# Patient Record
Sex: Female | Born: 1985 | Race: White | Hispanic: No | Marital: Married | Smoking: Never smoker
Health system: Southern US, Community
[De-identification: ages and names within clinical notes are randomized; demographics above are authoritative.]

## PROBLEM LIST (undated history)

## (undated) DIAGNOSIS — Z21 Asymptomatic human immunodeficiency virus [HIV] infection status: Secondary | ICD-10-CM

## (undated) DIAGNOSIS — T8859XA Other complications of anesthesia, initial encounter: Secondary | ICD-10-CM

## (undated) DIAGNOSIS — T4145XA Adverse effect of unspecified anesthetic, initial encounter: Secondary | ICD-10-CM

## (undated) DIAGNOSIS — T7840XA Allergy, unspecified, initial encounter: Secondary | ICD-10-CM

## (undated) DIAGNOSIS — B20 Human immunodeficiency virus [HIV] disease: Secondary | ICD-10-CM

## (undated) HISTORY — DX: Allergy, unspecified, initial encounter: T78.40XA

## (undated) HISTORY — PX: WISDOM TOOTH EXTRACTION: SHX21

---

## 2007-01-20 DIAGNOSIS — A6000 Herpesviral infection of urogenital system, unspecified: Secondary | ICD-10-CM | POA: Insufficient documentation

## 2008-07-15 ENCOUNTER — Emergency Department (HOSPITAL_COMMUNITY): Admission: EM | Admit: 2008-07-15 | Discharge: 2008-07-15 | Payer: Self-pay | Admitting: Emergency Medicine

## 2010-01-23 ENCOUNTER — Emergency Department (HOSPITAL_COMMUNITY): Admission: EM | Admit: 2010-01-23 | Discharge: 2010-01-23 | Payer: Self-pay | Admitting: Emergency Medicine

## 2011-08-05 LAB — WOUND CULTURE: Gram Stain: NONE SEEN

## 2013-07-17 ENCOUNTER — Ambulatory Visit (INDEPENDENT_AMBULATORY_CARE_PROVIDER_SITE_OTHER): Payer: BC Managed Care – PPO | Admitting: Family Medicine

## 2013-07-17 ENCOUNTER — Telehealth: Payer: Self-pay

## 2013-07-17 VITALS — BP 100/60 | HR 72 | Temp 98.9°F | Resp 16 | Ht 63.75 in | Wt 131.6 lb

## 2013-07-17 DIAGNOSIS — J029 Acute pharyngitis, unspecified: Secondary | ICD-10-CM

## 2013-07-17 LAB — POCT RAPID STREP A (OFFICE): Rapid Strep A Screen: NEGATIVE

## 2013-07-17 MED ORDER — MAGIC MOUTHWASH W/LIDOCAINE: 5.0000 mL | Freq: Four times a day (QID) | ORAL | Status: DC

## 2013-07-17 MED ORDER — AMOXICILLIN 875 MG PO TABS: 875.0000 mg | ORAL_TABLET | Freq: Two times a day (BID) | ORAL | Status: DC

## 2013-07-17 NOTE — Patient Instructions (Addendum)
Sore Throat A sore throat is pain, burning, irritation, or scratchiness of the throat. There is often pain or tenderness when swallowing or talking. A sore throat may be accompanied by other symptoms, such as coughing, sneezing, fever, and swollen neck glands. A sore throat is often the first sign of another sickness, such as a cold, flu, strep throat, or mononucleosis (commonly known as mono). Most sore throats go away without medical treatment. CAUSES  The most common causes of a sore throat include:  A viral infection, such as a cold, flu, or mono.  A bacterial infection, such as strep throat, tonsillitis, or whooping cough.  Seasonal allergies.  Dryness in the air.  Irritants, such as smoke or pollution.  Gastroesophageal reflux disease (GERD). HOME CARE INSTRUCTIONS   Only take over-the-counter medicines as directed by your caregiver.  Drink enough fluids to keep your urine clear or pale yellow.  Rest as needed.  Try using throat sprays, lozenges, or sucking on hard candy to ease any pain (if older than 4 years or as directed).  Sip warm liquids, such as broth, herbal tea, or warm water with honey to relieve pain temporarily. You may also eat or drink cold or frozen liquids such as frozen ice pops.  Gargle with salt water (mix 1 tsp salt with 8 oz of water).  Do not smoke and avoid secondhand smoke.  Put a cool-mist humidifier in your bedroom at night to moisten the air. You can also turn on a hot shower and sit in the bathroom with the door closed for 5 10 minutes. SEEK IMMEDIATE MEDICAL CARE IF:  You have difficulty breathing.  You are unable to swallow fluids, soft foods, or your saliva.  You have increased swelling in the throat.  Your sore throat does not get better in 7 days.  You have nausea and vomiting.  You have a fever or persistent symptoms for more than 2 3 days.  You have a fever and your symptoms suddenly get worse. MAKE SURE YOU:   Understand  these instructions.  Will watch your condition.  Will get help right away if you are not doing well or get worse. Document Released: 11/26/2004 Document Revised: 10/05/2012 Document Reviewed: 06/26/2012 Exit[employer] Patient Information 2014 [employer].  

## 2013-07-17 NOTE — Telephone Encounter (Signed)
Pt was seen today by dr Milus Glazier and she is wanting to know if she can have something for pain   Best number [phone]

## 2013-07-17 NOTE — Progress Notes (Signed)
  Subjective:    Patient ID: RAIZY AUZENNE, female    DOB: [birth year], 27 y.o.   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 States that she recently got back from a cruise to the [place] and was sick on the third day.  Now has bad sore throat x 4 days. Multiple strep infections when younger. Friend on cruise developed URI and cough prior to patient's coming down with illness   Review of Systems N, V, cough    Objective:   Physical Exam Alert and appropriate HEENT: Unremarkable except for the markedly swollen and red throat. Tonsils are approximately 2+ in size. Neck: Supple no adenopathy Skin: Without rash Results for orders placed in visit on 07/17/13  POCT RAPID STREP A (OFFICE)      Result Value Range   Rapid Strep A Screen Negative  Negative       Assessment & Plan:  Sore throat - Plan: POCT rapid strep A, Culture, Group A Strep  Signed, Elvina Sidle, MD

## 2013-07-18 ENCOUNTER — Telehealth: Payer: Self-pay | Admitting: Physician Assistant

## 2013-07-18 ENCOUNTER — Telehealth: Payer: Self-pay | Admitting: Family Medicine

## 2013-07-18 ENCOUNTER — Telehealth: Payer: Self-pay | Admitting: Radiology

## 2013-07-18 MED ORDER — HYDROCODONE-ACETAMINOPHEN 5-325 MG PO TABS: 1.0000 | ORAL_TABLET | Freq: Four times a day (QID) | ORAL | Status: DC | PRN

## 2013-07-18 NOTE — Addendum Note (Signed)
Addended byCaffie Damme on: 07/18/2013 01:29 PM   Modules accepted: Orders

## 2013-07-18 NOTE — Telephone Encounter (Signed)
Received call from [facility]- Pt allergic to CDN- we called in Norco 07/18/13. Pharmacist called to get Px changed.  Spoke to Dr. Elbert Ewings- changed Rx to Ultram 50mg  #10 Take 1 every 8 hours as needed for pain. 0 Refills. Eileen Stanford

## 2013-07-18 NOTE — Telephone Encounter (Signed)
Pharmacy called back about Rx/ they are having trouble with voicemail

## 2013-07-18 NOTE — Telephone Encounter (Signed)
See previous telephone note, pt requesting pain medication for sore throat.  Recommend 800mg  ibuprofen alternating with 1000mg  tylenol every 4 hours.  If pt is worsening, needs to RTC for further evaluation.  She has only been on amox for 24 hours, would expect some improvement within the next 24 hours if this is truly strep

## 2013-07-18 NOTE — Telephone Encounter (Signed)
Please call in Norco [phone]  #10 to be taken q8h prn

## 2013-07-18 NOTE — Telephone Encounter (Signed)
Called in, patient advised

## 2013-07-18 NOTE — Telephone Encounter (Signed)
Spoke with pt advised message from [place]. Pt understood and will keep [place] posted on progress.

## 2013-07-19 LAB — CULTURE, GROUP A STREP: Organism ID, Bacteria: NORMAL

## 2013-11-07 ENCOUNTER — Ambulatory Visit (INDEPENDENT_AMBULATORY_CARE_PROVIDER_SITE_OTHER): Payer: BC Managed Care – PPO | Admitting: Internal Medicine

## 2013-11-07 VITALS — BP 104/68 | HR 67 | Temp 98.3°F | Resp 16 | Ht 64.0 in | Wt 130.0 lb

## 2013-11-07 DIAGNOSIS — L089 Local infection of the skin and subcutaneous tissue, unspecified: Secondary | ICD-10-CM

## 2013-11-07 DIAGNOSIS — IMO0002 Reserved for concepts with insufficient information to code with codable children: Secondary | ICD-10-CM

## 2013-11-07 DIAGNOSIS — W57XXXS Bitten or stung by nonvenomous insect and other nonvenomous arthropods, sequela: Principal | ICD-10-CM

## 2013-11-07 DIAGNOSIS — S90466S Insect bite (nonvenomous), unspecified lesser toe(s), sequela: Principal | ICD-10-CM

## 2013-11-07 MED ORDER — DOXYCYCLINE HYCLATE 100 MG PO TABS: 100.0000 mg | ORAL_TABLET | Freq: Two times a day (BID) | ORAL | Status: DC

## 2013-11-07 NOTE — Progress Notes (Signed)
   Subjective:    Patient ID: Megan Mcclain, female    DOB: 1986/06/02, 28 y.o.   MRN: [id number]  HPI This chart was scribed for [facility] Tensley-MD by Smiley HousemanFallon Davis, Scribe. This patient was seen in room 4 and the patient's care was started at 8:03 PM.  HPI Comments: Megan KindsMerrick E Murch is a 28 y.o. female who presents to the [facility] complaining of a possible bug bite on her left fifth toe that occurred yesterday evening after work.  Pt states she placed her feet in her bedroom slippers when she started having pain.  She reports the area is red, swollen, and itches.  She reports, "it feels like my entire foot is itching now."  She states that compared to her other pinky toe, her left pinky toe is swollen.  She is allergic to Vicodin and Caine-1.    History reviewed. No pertinent past surgical history.  Allergies  Allergen Reactions  . Caine-1 [Lidocaine]     Patient can take lidocaine but novacaine causes swelling  . Vicodin [Hydrocodone-Acetaminophen] Nausea And Vomiting    There are no active problems to display for this patient.   Review of Systems  Constitutional: Negative for fever and chills.  HENT: Negative for congestion and rhinorrhea.   Respiratory: Negative for cough and shortness of breath.   Cardiovascular: Negative for chest pain.  Gastrointestinal: Negative for nausea, vomiting, abdominal pain and diarrhea.  Musculoskeletal: Negative for arthralgias and back pain.  Skin: Negative for color change and rash.  Neurological: Negative for syncope.       Objective:   Physical Exam  Nursing note and vitals reviewed. Constitutional: She is oriented to person, place, and time. She appears well-developed and well-nourished. No distress.  HENT:  Head: Normocephalic and atraumatic.  Eyes: Conjunctivae and EOM are normal. Right eye exhibits no discharge. Left eye exhibits no discharge.  Neck: Normal range of motion.  Cardiovascular: Normal rate.     Pulmonary/Chest: Effort normal. No respiratory distress.  Musculoskeletal: Normal range of motion.  Neurological: She is alert and oriented to person, place, and time.  Skin: Skin is warm and dry.  Lesion on medial aspect of the fifth toe at the edge of the nail that is a large vesicle with fluid that is cloudy surrounded by erythema.  This is tender to palpation.  The joint is uninvolved.  The rest of the skin is clear.    Psychiatric: She has a normal mood and affect. Her behavior is normal.    Triage Vitals: BP 104/68  Pulse 67  Temp(Src) 98.3 F (36.8 C) (Oral)  Resp 16  Ht 5\' 4"  (1.626 m)  Wt 130 lb (58.968 kg)  BMI 22.30 kg/m2  SpO2 100%  COORDINATION OF CARE: 8:07 PM-Will prescribe doxycycline.  Patient informed of current plan of treatment and evaluation and agrees with plan.       Assessment & Plan:  Nonvenomous insect bite of toe with infection, unspecified laterality, sequela    Meds ordered this encounter  Medications  . doxycycline (VIBRA-TABS) 100 MG tablet    Sig: Take 1 tablet (100 mg total) by mouth 2 (two) times daily.    Dispense:  14 tablet    Refill:  0      I have completed the patient encounter in its entirety as documented by the scribe, with editing by me where necessary. Taleah Bellantoni P. Merla Richesoolittle, M.D.

## 2013-12-12 DIAGNOSIS — J029 Acute pharyngitis, unspecified: Secondary | ICD-10-CM | POA: Insufficient documentation

## 2014-02-04 ENCOUNTER — Encounter (HOSPITAL_COMMUNITY): Payer: Self-pay | Admitting: Emergency Medicine

## 2014-02-04 ENCOUNTER — Emergency Department (HOSPITAL_COMMUNITY)
Admission: EM | Admit: 2014-02-04 | Discharge: 2014-02-04 | Disposition: A | Discharge status: Home / Self Care | Payer: BC Managed Care – PPO | Attending: Emergency Medicine | Admitting: Emergency Medicine

## 2014-02-04 ENCOUNTER — Emergency Department (HOSPITAL_COMMUNITY): Payer: BC Managed Care – PPO

## 2014-02-04 DIAGNOSIS — Y929 Unspecified place or not applicable: Secondary | ICD-10-CM | POA: Insufficient documentation

## 2014-02-04 DIAGNOSIS — S6992XA Unspecified injury of left wrist, hand and finger(s), initial encounter: Secondary | ICD-10-CM

## 2014-02-04 DIAGNOSIS — Y9389 Activity, other specified: Secondary | ICD-10-CM | POA: Insufficient documentation

## 2014-02-04 DIAGNOSIS — W010XXA Fall on same level from slipping, tripping and stumbling without subsequent striking against object, initial encounter: Secondary | ICD-10-CM | POA: Insufficient documentation

## 2014-02-04 DIAGNOSIS — S6980XA Other specified injuries of unspecified wrist, hand and finger(s), initial encounter: Secondary | ICD-10-CM | POA: Insufficient documentation

## 2014-02-04 DIAGNOSIS — M25449 Effusion, unspecified hand: Secondary | ICD-10-CM | POA: Insufficient documentation

## 2014-02-04 DIAGNOSIS — S6990XA Unspecified injury of unspecified wrist, hand and finger(s), initial encounter: Principal | ICD-10-CM | POA: Insufficient documentation

## 2014-02-04 MED ORDER — TRAMADOL HCL 50 MG PO TABS: 50.0000 mg | ORAL_TABLET | Freq: Four times a day (QID) | ORAL | Status: DC | PRN

## 2014-02-04 NOTE — ED Notes (Signed)
Pt states that she got home late last night and tripped over her dogs.  Braced herself on the wall and injured her thumb.  Swelling to lt thumb.

## 2014-02-04 NOTE — Discharge Instructions (Signed)
Take the prescribed medication as directed.  May take with tylenol for added relief. May continue icing and elevating your hand at home for pain and swelling control. Follow-up with your primary care physician as needed. Return to the ED for new or worsening symptoms.

## 2014-02-04 NOTE — ED Provider Notes (Signed)
CSN: [id number]     Arrival date & time 02/04/14  0806 History   First MD Initiated Contact with Patient 02/04/14 0813     Chief Complaint  Patient presents with  . Hand Injury     (Consider location/radiation/quality/duration/timing/severity/associated sxs/prior Treatment) Patient is a 28 y.o. female presenting with hand injury. The history is provided by the patient and medical records.  Hand Injury  This is a 28 year old female with no significant past medical history presenting to the ED for left thumb injury. Patient states she tripped over her dog and attempted to break her fall with her left hand.  She denies head trauma or LOC.  Patient said she had immediate onset of pain and swelling to base of the phone. She denies numbness or paresthesias.  No prior left hand injuries. Patient is right-hand dominant.  Past Medical History  Diagnosis Date  . Allergy    No past surgical history on file. Family History  Problem Relation Age of Onset  . Diabetes Mother   . Prostate cancer Father   . Allergies Father   . Allergies Brother   . Heart failure Paternal Grandmother    History  Substance Use Topics  . Smoking status: Never Smoker   . Smokeless tobacco: Not on file  . Alcohol Use: Not on file   OB History   Grav Para Term Preterm Abortions TAB SAB Ect Mult Living                 Review of Systems  Musculoskeletal: Positive for arthralgias and joint swelling.  All other systems reviewed and are negative.      Allergies  Caine-1 and Vicodin  Home Medications   Current Outpatient Rx  Name  Route  Sig  Dispense  Refill  . doxycycline (VIBRA-TABS) 100 MG tablet   Oral   Take 1 tablet (100 mg total) by mouth 2 (two) times daily.   14 tablet   0    There were no vitals taken for this visit. Physical Exam  Nursing note and vitals reviewed. Constitutional: She is oriented to person, place, and time. She appears well-developed and well-nourished. No distress.    HENT:  Head: Normocephalic and atraumatic.  Mouth/Throat: Oropharynx is clear and moist.  Eyes: Conjunctivae and EOM are normal. Pupils are equal, round, and reactive to light.  Neck: Normal range of motion. Neck supple.  Cardiovascular: Normal rate, regular rhythm and normal heart sounds.   Pulmonary/Chest: Effort normal and breath sounds normal. No respiratory distress. She has no wheezes.  Musculoskeletal:       Left hand: She exhibits tenderness and swelling. She exhibits normal range of motion, no bony tenderness, normal capillary refill, no deformity and no laceration. Normal sensation noted. Normal strength noted.       Hands: Swelling and tenderness to palpation at base of left thumb, no gross deformities; full flexion and extension but with some pain; full range of motion of wrist maintained; strong radial pulse and cap refill, sensation intact  Neurological: She is alert and oriented to person, place, and time.  Skin: Skin is warm and dry. She is not diaphoretic.  Psychiatric: She has a normal mood and affect.    ED Course  ORTHOPEDIC INJURY TREATMENT Date/Time: 02/04/2014 9:33 AM Performed by: Garlon HatchetSANDERS, Quinnton Bury M Authorized by: Garlon HatchetSANDERS, Noretta Frier M Consent: Verbal consent obtained. Risks and benefits: risks, benefits and alternatives were discussed Consent given by: patient Patient understanding: patient states understanding of the procedure being performed  Patient identity confirmed: verbally with patient Injury location: hand Location details: left hand Injury type: soft tissue Pre-procedure neurovascular assessment: neurovascularly intact Pre-procedure distal perfusion: normal Pre-procedure neurological function: normal Immobilization: brace Supplies used: elastic bandage Post-procedure neurovascular assessment: post-procedure neurovascularly intact Post-procedure distal perfusion: normal Post-procedure neurological function: normal Post-procedure range of motion:  normal Patient tolerance: Patient tolerated the procedure well with no immediate complications.   (including critical care time) Labs Review Labs Reviewed - No data to display Imaging Review Dg Finger Thumb Left  02/04/2014   CLINICAL DATA:  Traumatic injury and pain  EXAM: LEFT THUMB 2+V  COMPARISON:  None.  FINDINGS: There is no evidence of fracture or dislocation. There is no evidence of arthropathy or other focal bone abnormality. Soft tissues are unremarkable  IMPRESSION: No acute abnormality noted.   Electronically Signed   By: Alcide Clever M.D.   On: 02/04/2014 08:48     EKG Interpretation None      MDM   Final diagnoses:  Injury of thumb, left   X-ray negative for acute fracture or dislocation, likely finger jam.  Hand wrapped with ace-wrap for comfort with some improvement of pain.  Rx tramadol, continue RICE routine at home to help control sx.  Discussed plan with pt, she acknowledged understanding and agreed with plan of care. Return precautions advised for new or worsening symptoms.  Garlon Hatchet, PA-C 02/04/14 [phone]

## 2014-02-04 NOTE — ED Provider Notes (Signed)
Medical screening examination/treatment/procedure(s) were performed by non-physician practitioner and as supervising physician I was immediately available for consultation/collaboration.   EKG Interpretation None       Ethelda ChickMartha K Linker, MD 02/04/14 [id number]

## 2015-01-24 IMAGING — CR DG FINGER THUMB 2+V*L*
3 series · 3 of 3 positions shown · non-contrast
Comparison: None.

CLINICAL DATA: Traumatic injury and pain

EXAM:
LEFT THUMB 2+V

[x finger pa left]
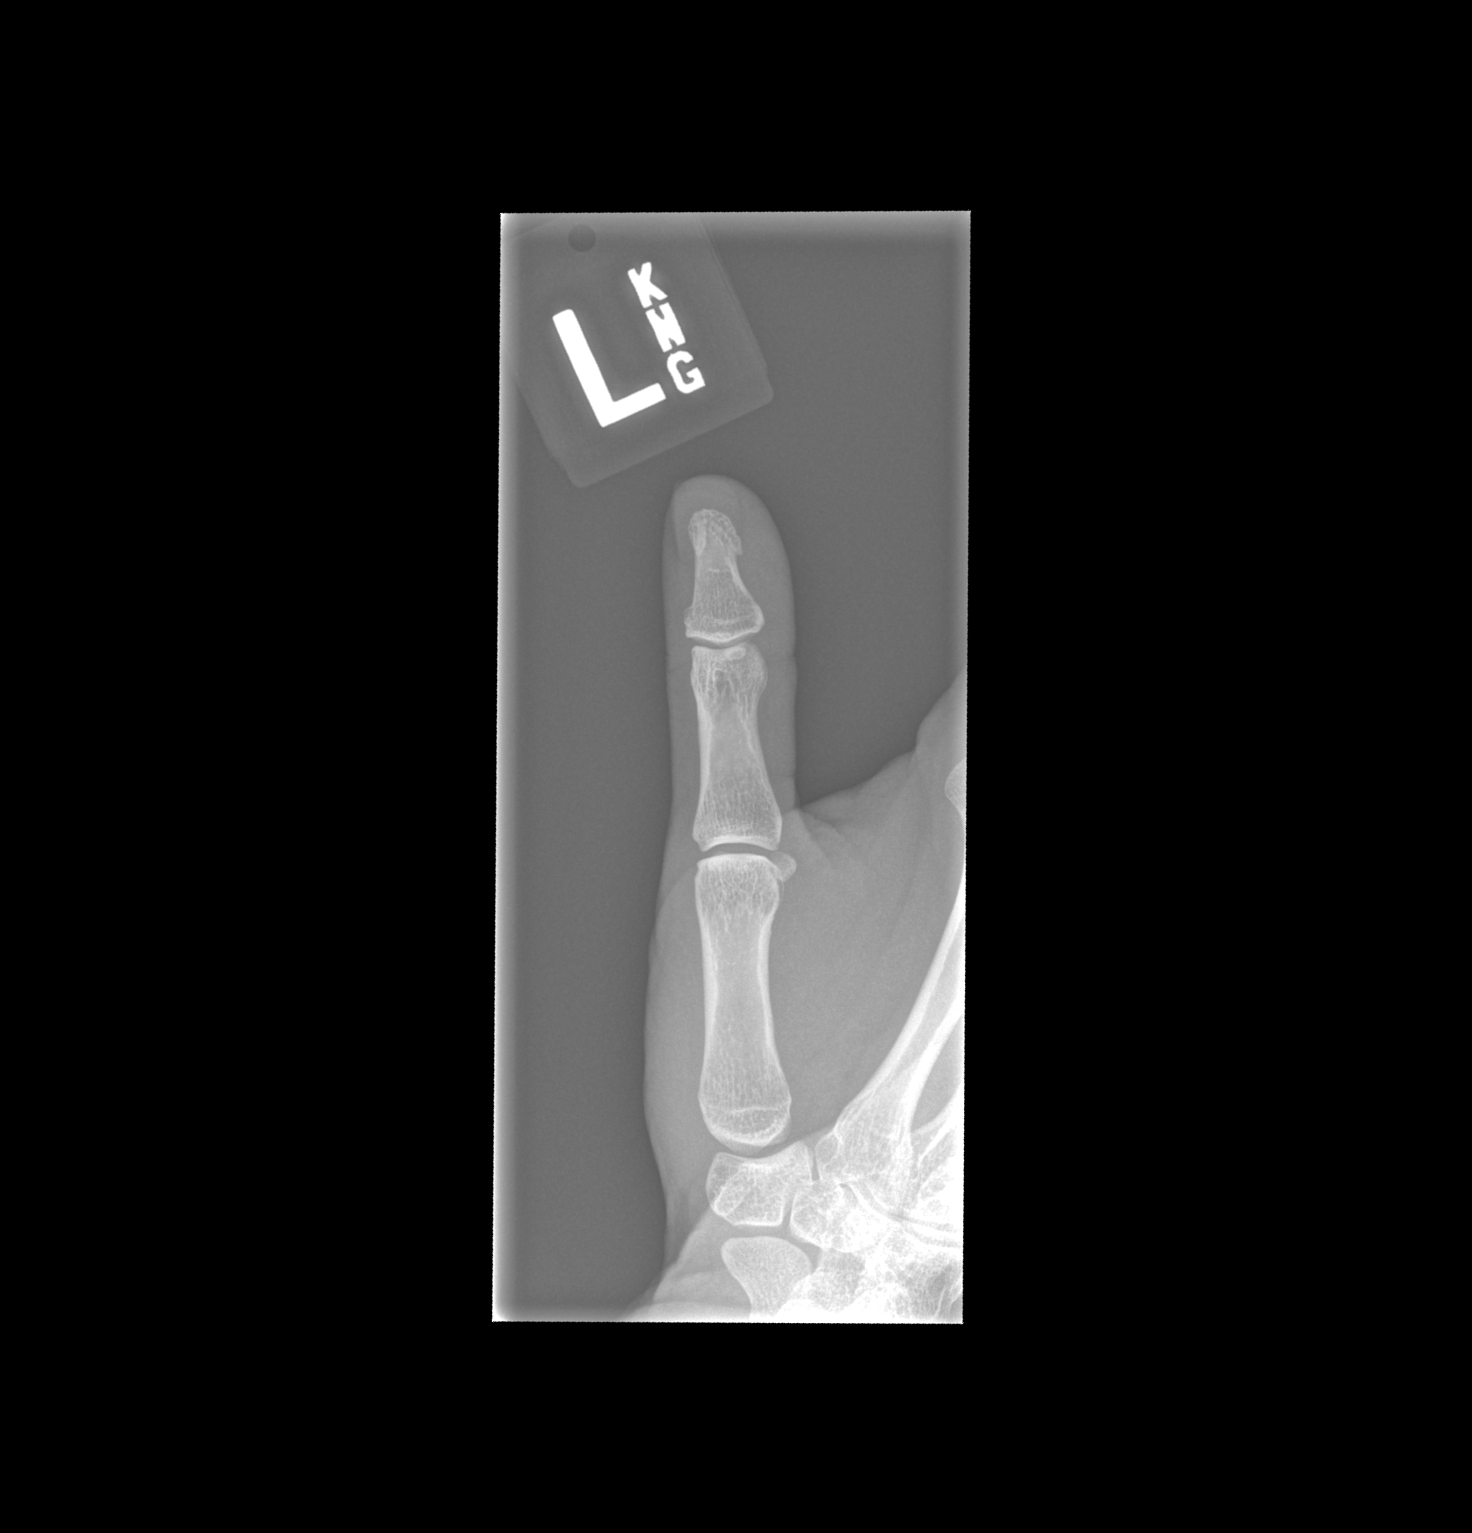

[x finger obl left]
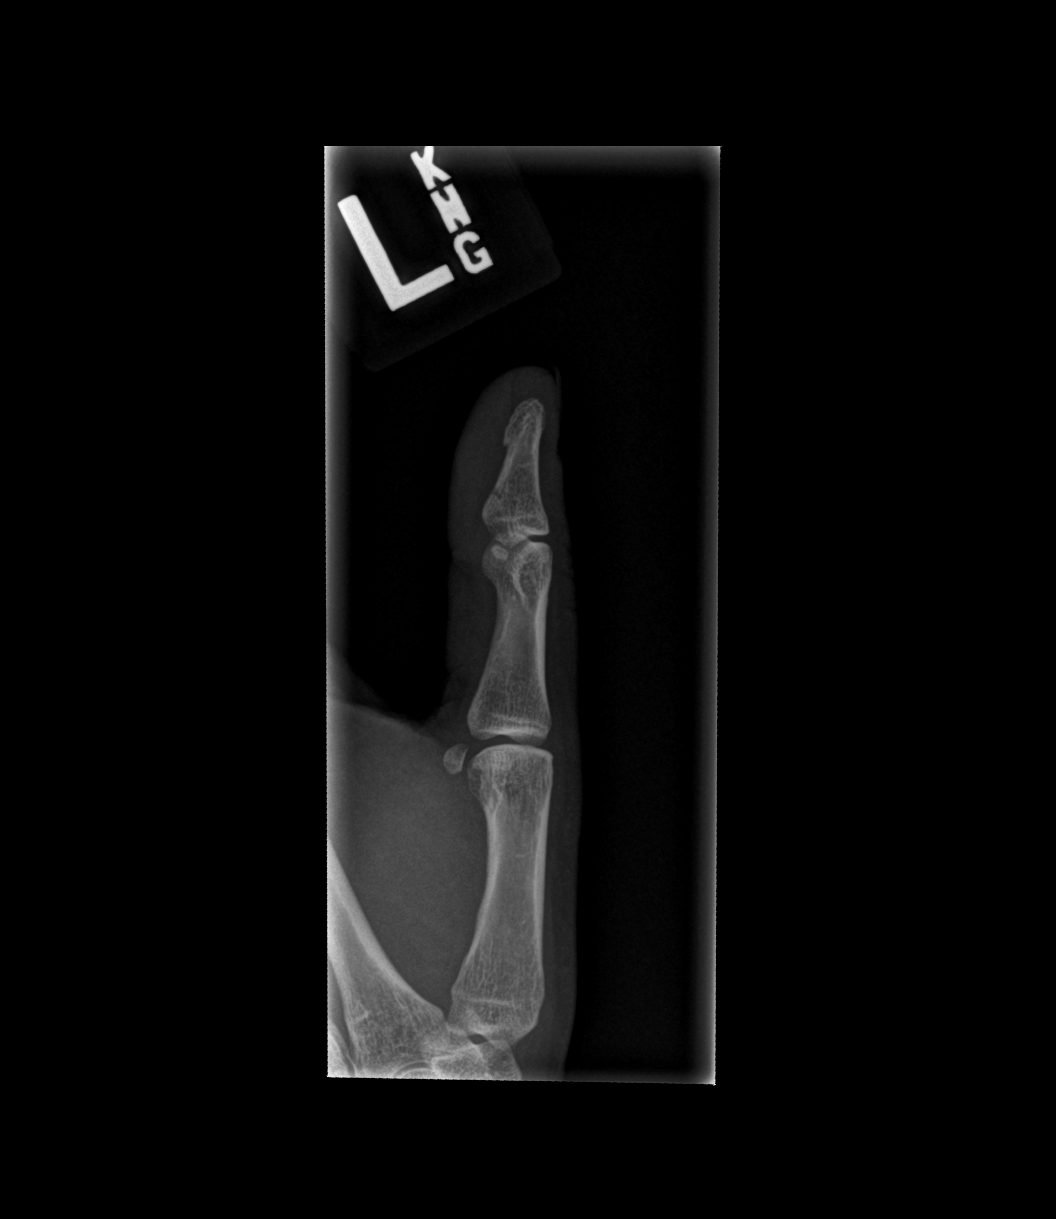

[x finger lat left]
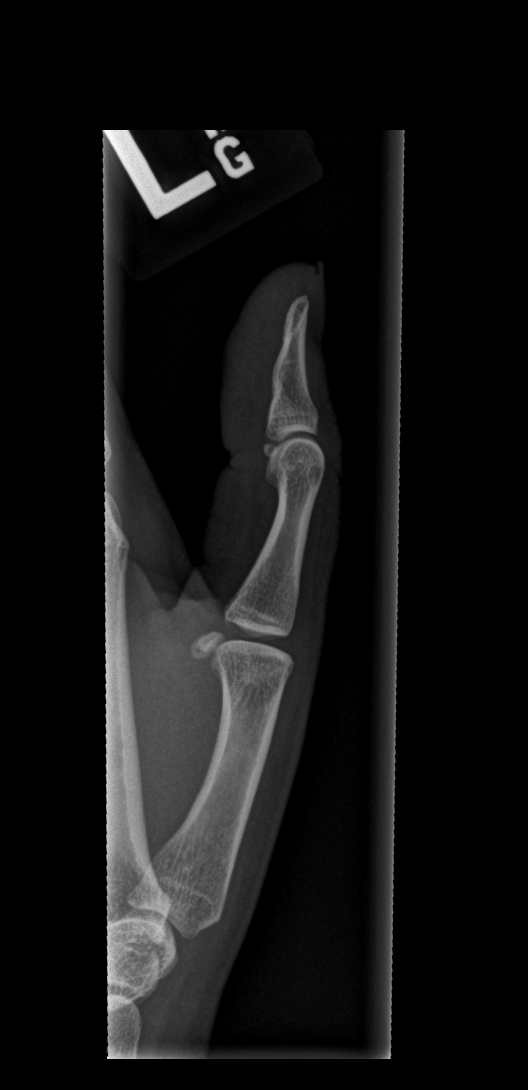

[3 of 3 positions shown; findings below may reference images not displayed]

FINDINGS: There is no evidence of fracture or dislocation. There is no
evidence of arthropathy or other focal bone abnormality. Soft
tissues are unremarkable
IMPRESSION: No acute abnormality noted.

## 2016-06-08 DIAGNOSIS — Z21 Asymptomatic human immunodeficiency virus [HIV] infection status: Secondary | ICD-10-CM | POA: Insufficient documentation

## 2016-06-30 DIAGNOSIS — Z21 Asymptomatic human immunodeficiency virus [HIV] infection status: Secondary | ICD-10-CM | POA: Insufficient documentation

## 2016-07-03 DIAGNOSIS — N760 Acute vaginitis: Secondary | ICD-10-CM | POA: Insufficient documentation

## 2016-11-18 ENCOUNTER — Encounter (HOSPITAL_COMMUNITY): Payer: Self-pay | Admitting: Emergency Medicine

## 2016-11-18 ENCOUNTER — Emergency Department (HOSPITAL_COMMUNITY)
Admission: EM | Admit: 2016-11-18 | Discharge: 2016-11-18 | Disposition: A | Discharge status: Home / Self Care | Payer: Managed Care, Other (non HMO) | Attending: Emergency Medicine | Admitting: Emergency Medicine

## 2016-11-18 DIAGNOSIS — R112 Nausea with vomiting, unspecified: Secondary | ICD-10-CM | POA: Diagnosis not present

## 2016-11-18 LAB — URINALYSIS, ROUTINE W REFLEX MICROSCOPIC
Bacteria, UA: NONE SEEN
GLUCOSE, UA: NEGATIVE mg/dL
HGB URINE DIPSTICK: NEGATIVE
Ketones, ur: 80 mg/dL — AB
Leukocytes, UA: NEGATIVE
NITRITE: NEGATIVE
PH: 5 (ref 5.0–8.0)
Protein, ur: 100 mg/dL — AB
SPECIFIC GRAVITY, URINE: 1.044 — AB (ref 1.005–1.030)

## 2016-11-18 LAB — CBC
HCT: 40.2 % (ref 36.0–46.0)
HEMOGLOBIN: 14.2 g/dL (ref 12.0–15.0)
MCH: 35.7 pg — AB (ref 26.0–34.0)
MCHC: 35.3 g/dL (ref 30.0–36.0)
MCV: 101 fL — ABNORMAL HIGH (ref 78.0–100.0)
PLATELETS: 198 10*3/uL (ref 150–400)
RBC: 3.98 MIL/uL (ref 3.87–5.11)
RDW: 12.1 % (ref 11.5–15.5)
WBC: 15.2 10*3/uL — ABNORMAL HIGH (ref 4.0–10.5)

## 2016-11-18 LAB — COMPREHENSIVE METABOLIC PANEL
ALBUMIN: 4.3 g/dL (ref 3.5–5.0)
ALK PHOS: 49 U/L (ref 38–126)
ALT: 9 U/L — AB (ref 14–54)
ANION GAP: 9 (ref 5–15)
AST: 18 U/L (ref 15–41)
BILIRUBIN TOTAL: 0.8 mg/dL (ref 0.3–1.2)
BUN: 18 mg/dL (ref 6–20)
CALCIUM: 9.3 mg/dL (ref 8.9–10.3)
CO2: 23 mmol/L (ref 22–32)
CREATININE: 0.74 mg/dL (ref 0.44–1.00)
Chloride: 104 mmol/L (ref 101–111)
GFR calc Af Amer: >60 mL/min (ref >60)
GFR calc non Af Amer: >60 mL/min (ref >60)
GLUCOSE: 99 mg/dL (ref 65–99)
Potassium: 3.6 mmol/L (ref 3.5–5.1)
Sodium: 136 mmol/L (ref 135–145)
TOTAL PROTEIN: 7.8 g/dL (ref 6.5–8.1)

## 2016-11-18 LAB — LIPASE, BLOOD: Lipase: 20 U/L (ref 11–51)

## 2016-11-18 LAB — PREGNANCY, URINE: Preg Test, Ur: NEGATIVE

## 2016-11-18 MED ORDER — ONDANSETRON 8 MG PO TBDP: 8.0000 mg | ORAL_TABLET | Freq: Three times a day (TID) | ORAL | 0 refills | Status: DC | PRN

## 2016-11-18 MED ORDER — SODIUM CHLORIDE 0.9 % IV BOLUS (SEPSIS)
1000.0000 mL | Freq: Once | INTRAVENOUS | Status: AC
  Administered 2016-11-18: 1000 mL via INTRAVENOUS

## 2016-11-18 MED ORDER — ONDANSETRON HCL 4 MG/2ML IJ SOLN
4.0000 mg | Freq: Once | INTRAMUSCULAR | Status: AC | PRN
  Administered 2016-11-18: 4 mg via INTRAVENOUS
  Filled 2016-11-18: qty 2

## 2016-11-18 NOTE — ED Triage Notes (Signed)
Per pt, states N/V since yesterday-cant keep POs down-no diarrhea, no abdominal pain

## 2016-11-18 NOTE — ED Provider Notes (Signed)
[facility]-EMERGENCY DEPT Provider Note   CSN: [id number] Arrival date & time: 11/18/16  0827     History   Chief Complaint Chief Complaint  Patient presents with  . Nausea  . Emesis    HPI Megan Mcclain is a 31 y.o. female.  HPI Patient presents emergency department nausea vomiting since yesterday.  She's had decreased oral intake.  She denies abdominal pain.  Denies diarrhea.  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  No dysuria or urinary frequency.  No missed menstrual cycles.  Symptoms are moderate in severity   Past Medical History:  Diagnosis Date  . Allergy     There are no active problems to display for this patient.   History reviewed. No pertinent surgical history.  OB History    No data available       Home Medications    Prior to Admission medications   Medication Sig Start Date End Date Taking? Authorizing Provider  DM-Doxylamine-Acetaminophen (VICKS NYQUIL COLD & FLU) 15-6.25-325 MG CAPS Take 2 capsules by mouth every 4 (four) hours as needed (for cold).   Yes Historical Provider, MD  DM-Phenylephrine-Acetaminophen (VICKS DAYQUIL COLD & FLU) 10-5-325 MG CAPS Take 2 capsules by mouth every 4 (four) hours as needed (for cold).   Yes Historical Provider, MD  emtricitabine-rilpivir-tenofovir AF (ODEFSEY) 200-25-25 MG TABS tablet Take 1 tablet by mouth at bedtime.   Yes Historical Provider, MD  Multiple Vitamin (MULTIVITAMIN WITH MINERALS) TABS tablet Take 1 tablet by mouth daily.   Yes Historical Provider, MD  ondansetron (ZOFRAN ODT) 8 MG disintegrating tablet Take 1 tablet (8 mg total) by mouth every 8 (eight) hours as needed for nausea or vomiting. 11/18/16   Azalia BilisKevin Zakariyah Freimark, MD    Family History Family History  Problem Relation Age of Onset  . Diabetes Mother   . Prostate cancer Father   . Allergies Father   . Allergies Brother   .  Heart failure Paternal Grandmother     Social History Social History  Substance Use Topics  . Smoking status: Never Smoker  . Smokeless tobacco: Not on file  . Alcohol use Yes     Allergies   Other and Vicodin [hydrocodone-acetaminophen]   Review of Systems Review of Systems  All other systems reviewed and are negative.    Physical Exam Updated Vital Signs BP 116/77   Pulse 107   Temp 98.1 F (36.7 C) (Oral)   Resp 18   LMP 11/02/2016   SpO2 100%   Physical Exam  Constitutional: She is oriented to person, place, and time. She appears well-developed and well-nourished. No distress.  HENT:  Head: Normocephalic and atraumatic.  Eyes: EOM are normal.  Neck: Normal range of motion.  Cardiovascular: Normal rate and regular rhythm.   Pulmonary/Chest: Effort normal and breath sounds normal.  Abdominal: Soft. She exhibits no distension. There is no tenderness.  Musculoskeletal: Normal range of motion.  Neurological: She is alert and oriented to person, place, and time.  Skin: Skin is warm and dry.  Psychiatric: She has a normal mood and affect. Judgment normal.  Nursing note and vitals reviewed.    ED Treatments / Results  Labs (all labs ordered are listed, but only abnormal results are displayed) Labs Reviewed  COMPREHENSIVE METABOLIC PANEL - Abnormal; Notable for the following:       Result Value  ALT 9 (*)    All other components within normal limits  CBC - Abnormal; Notable for the following:    WBC 15.2 (*)    MCV 101.0 (*)    MCH 35.7 (*)    All other components within normal limits  URINALYSIS, ROUTINE W REFLEX MICROSCOPIC - Abnormal; Notable for the following:    Color, Urine AMBER (*)    Specific Gravity, Urine 1.044 (*)    Bilirubin Urine SMALL (*)    Ketones, ur 80 (*)    Protein, ur 100 (*)    Squamous Epithelial / LPF 0-5 (*)    All other components within normal limits  LIPASE, BLOOD  PREGNANCY, URINE    EKG  EKG Interpretation None         Radiology No results found.  Procedures Procedures (including critical care time)  Medications Ordered in ED Medications  ondansetron (ZOFRAN) injection 4 mg (4 mg Intravenous Given 11/18/16 0849)  sodium chloride 0.9 % bolus 1,000 mL (0 mLs Intravenous Stopped 11/18/16 0926)     Initial Impression / Assessment and Plan / ED Course  I have reviewed the triage vital signs and the nursing notes.  Pertinent labs & imaging results that were available during my care of the patient were reviewed by me and considered in my medical decision making (see chart for details).  Clinical Course     10:50 AM Patient feels much better.  Repeat abdominal exam is benign.  Home with Zofran.  She understands to return to the ER for new or worsening symptoms  Final Clinical Impressions(s) / ED Diagnoses   Final diagnoses:  Nausea and vomiting, intractability of vomiting not specified, unspecified vomiting type    New Prescriptions New Prescriptions   ONDANSETRON (ZOFRAN ODT) 8 MG DISINTEGRATING TABLET    Take 1 tablet (8 mg total) by mouth every 8 (eight) hours as needed for nausea or vomiting.     Azalia Bilis, MD 11/18/16 1050

## 2019-11-02 DIAGNOSIS — L209 Atopic dermatitis, unspecified: Secondary | ICD-10-CM | POA: Insufficient documentation

## 2021-08-11 DIAGNOSIS — N979 Female infertility, unspecified: Secondary | ICD-10-CM | POA: Insufficient documentation

## 2021-11-02 NOTE — L&D Delivery Note (Signed)
Operative Note  PROCEDURE DATE: 06/19/22   PREOPERATIVE DIAGNOSIS: malpresentation, PROM, HIV+   POSTOPERATIVE DIAGNOSIS: The same   PROCEDURE:    Primary Low Transverse Cesarean Section   SURGEON:  Dr. Nilda Simmer   INDICATIONS: This is a 36 y.o. yo G1P0 at [redacted]w[redacted]d requiring cesarean section secondary to PROM and malpresentation. Baby has been breech or transverse in the office, upon presentation baby in oblique presentation.   Decision made to proceed with LTCS. The risks of cesarean section discussed with the patient included but were not limited to: bleeding which may require transfusion or reoperation; infection which may require antibiotics; injury to bowel, bladder, ureters or other surrounding organs; injury to the fetus; need for additional procedures including hysterectomy in the event of a life-threatening hemorrhage; placental abnormalities wth subsequent pregnancies, incisional problems, thromboembolic phenomenon and other postoperative/anesthesia complications. The patient agreed with the proposed plan, giving informed consent for the procedure.     FINDINGS:  Viable female infant in oblique > vertex presentation, APGARs 9, 9,  Weight pending, Amniotic fluid clear,  Intact placenta, three vessel cord.  Grossly normal uterus .   ANESTHESIA:    Epidural ESTIMATED BLOOD LOSS: Per record SPECIMENS: Placenta for routine COMPLICATIONS: None immediate    PROCEDURE IN DETAIL:  The patient received intravenous antibiotics (Ancef and azithromycin for ROM) and had sequential compression devices applied to her lower extremities while in the preoperative area.  She was then taken to the operating room where epidural anesthesia was dosed up to surgical level and was found to be adequate. She was then placed in a dorsal supine position with a leftward tilt, and prepped and draped in a sterile manner.  A foley catheter was placed into her bladder and attached to constant gravity.  After an  adequate timeout was performed, a Pfannenstiel skin incision was made with scalpel and carried through to the underlying layer of fascia. The fascia was incised in the midline and this incision was extended bilaterally using the Mayo scissors. Kocher clamps were applied to the superior aspect of the fascial incision and the underlying rectus muscles were dissected off bluntly. A similar process was carried out on the inferior aspect of the facial incision. The rectus muscles were separated in the midline bluntly and the peritoneum was entered bluntly.  A bladder flap was created sharply and developed bluntly. A transverse hysterotomy was made with a scalpel and extended bilaterally bluntly. The bladder blade was then removed. The infant was successfully delivered, and cord was clamped and cut and infant was handed over to awaiting neonatology team. Uterine massage was then administered and the placenta delivered intact with three-vessel cord. The uterus was cleared of clot and debris.  The hysterotomy was closed with 0 vicryl.  A second imbricating suture of 0-vicryl was used to reinforce the incision and aid in hemostasis.The fascia was closed with 0-Vicryl in a running fashion with good restoration of anatomy.  The subcutaneus tissue was irrigated and was reapproximated using running plain gut.  The skin was closed with 4-0 Vicryl in a subcuticular fashion.  All surgical site and was hemostatic at end of procedure without any further bleeding on exam.    Pt tolerated the procedure well. All sponge/lap/needle counts were correct  X 2. Pt taken to recovery room in stable condition.     Nilda Simmer MD

## 2021-11-19 LAB — OB RESULTS CONSOLE RUBELLA ANTIBODY, IGM: Rubella: IMMUNE

## 2021-11-19 LAB — OB RESULTS CONSOLE HEPATITIS B SURFACE ANTIGEN: Hepatitis B Surface Ag: NEGATIVE

## 2021-11-19 LAB — OB RESULTS CONSOLE GC/CHLAMYDIA
Chlamydia: NEGATIVE
Neisseria Gonorrhea: NEGATIVE

## 2021-11-19 LAB — OB RESULTS CONSOLE HIV ANTIBODY (ROUTINE TESTING): HIV: REACTIVE

## 2021-11-19 LAB — OB RESULTS CONSOLE RPR: RPR: NONREACTIVE

## 2021-11-19 LAB — OB RESULTS CONSOLE ABO/RH: RH Type: POSITIVE

## 2021-11-19 LAB — OB RESULTS CONSOLE ANTIBODY SCREEN: Antibody Screen: NEGATIVE

## 2021-11-19 LAB — HEPATITIS C ANTIBODY: HCV Ab: NEGATIVE

## 2022-01-16 DIAGNOSIS — O98912 Unspecified maternal infectious and parasitic disease complicating pregnancy, second trimester: Secondary | ICD-10-CM | POA: Insufficient documentation

## 2022-06-04 LAB — OB RESULTS CONSOLE GBS: GBS: NEGATIVE

## 2022-06-09 ENCOUNTER — Encounter (HOSPITAL_COMMUNITY): Payer: Self-pay

## 2022-06-09 ENCOUNTER — Encounter (HOSPITAL_COMMUNITY): Payer: Self-pay | Admitting: *Deleted

## 2022-06-09 NOTE — Patient Instructions (Signed)
Megan Mcclain  06/09/2022   Your procedure is scheduled on:  06/23/2022  Arrive at 0530 at Entrance C on [employer] at [facility]  and [employer]. You are invited to use the FREE valet parking or use the Visitor's parking deck.  Pick up the phone at the desk and dial [phone].  Call this number if you have problems the morning of surgery: [phone]  Remember:   Do not eat food:(After Midnight) Desps de medianoche.  Do not drink clear liquids: (After Midnight) Desps de medianoche.  Take these medicines the morning of surgery with A SIP OF WATER:  Take odefsey as prescribed   Do not wear jewelry, make-up or nail polish.  Do not wear lotions, powders, or perfumes. Do not wear deodorant.  Do not shave 48 hours prior to surgery.  Do not bring valuables to the hospital.  [facility] is not   responsible for any belongings or valuables brought to the hospital.  Contacts, dentures or bridgework may not be worn into surgery.  Leave suitcase in the car. After surgery it may be brought to your room.  For patients admitted to the hospital, checkout time is 11:00 AM the day of              discharge.      Please read over the following fact sheets that you were given:     Preparing for Surgery

## 2022-06-18 ENCOUNTER — Encounter (HOSPITAL_COMMUNITY): Payer: Self-pay | Admitting: Obstetrics and Gynecology

## 2022-06-18 ENCOUNTER — Inpatient Hospital Stay (HOSPITAL_COMMUNITY)
Admission: AD | Admit: 2022-06-18 | Discharge: 2022-06-21 | DRG: 787 | Disposition: A | Discharge status: Home / Self Care | Payer: Managed Care, Other (non HMO) | Attending: Obstetrics and Gynecology | Admitting: Obstetrics and Gynecology

## 2022-06-18 DIAGNOSIS — O322XX0 Maternal care for transverse and oblique lie, not applicable or unspecified: Secondary | ICD-10-CM | POA: Diagnosis present

## 2022-06-18 DIAGNOSIS — O4292 Full-term premature rupture of membranes, unspecified as to length of time between rupture and onset of labor: Principal | ICD-10-CM | POA: Diagnosis present

## 2022-06-18 DIAGNOSIS — Z3A39 39 weeks gestation of pregnancy: Secondary | ICD-10-CM | POA: Diagnosis not present

## 2022-06-18 DIAGNOSIS — Z3A38 38 weeks gestation of pregnancy: Secondary | ICD-10-CM | POA: Diagnosis not present

## 2022-06-18 DIAGNOSIS — O9832 Other infections with a predominantly sexual mode of transmission complicating childbirth: Secondary | ICD-10-CM | POA: Diagnosis present

## 2022-06-18 DIAGNOSIS — O9872 Human immunodeficiency virus [HIV] disease complicating childbirth: Secondary | ICD-10-CM | POA: Diagnosis present

## 2022-06-18 DIAGNOSIS — Z21 Asymptomatic human immunodeficiency virus [HIV] infection status: Secondary | ICD-10-CM | POA: Diagnosis present

## 2022-06-18 DIAGNOSIS — A6000 Herpesviral infection of urogenital system, unspecified: Secondary | ICD-10-CM | POA: Diagnosis present

## 2022-06-18 DIAGNOSIS — Z3490 Encounter for supervision of normal pregnancy, unspecified, unspecified trimester: Secondary | ICD-10-CM

## 2022-06-18 DIAGNOSIS — O26893 Other specified pregnancy related conditions, third trimester: Secondary | ICD-10-CM | POA: Diagnosis present

## 2022-06-18 LAB — CBC
HCT: 29.3 % — ABNORMAL LOW (ref 36.0–46.0)
Hemoglobin: 10.3 g/dL — ABNORMAL LOW (ref 12.0–15.0)
MCH: 35.6 pg — ABNORMAL HIGH (ref 26.0–34.0)
MCHC: 35.2 g/dL (ref 30.0–36.0)
MCV: 101.4 fL — ABNORMAL HIGH (ref 80.0–100.0)
Platelets: 239 10*3/uL (ref 150–400)
RBC: 2.89 MIL/uL — ABNORMAL LOW (ref 3.87–5.11)
RDW: 12.8 % (ref 11.5–15.5)
WBC: 13 10*3/uL — ABNORMAL HIGH (ref 4.0–10.5)
nRBC: 0 % (ref 0.0–0.2)

## 2022-06-18 MED ORDER — SODIUM CHLORIDE 0.9 % IV SOLN
500.0000 mg | INTRAVENOUS | Status: AC
  Administered 2022-06-19: 500 mg via INTRAVENOUS
  Filled 2022-06-18: qty 5

## 2022-06-18 MED ORDER — ZIDOVUDINE 10 MG/ML IV SOLN
1.0000 mg/kg/h | INTRAVENOUS | Status: DC
  Administered 2022-06-19: 1 mg/kg/h via INTRAVENOUS
  Filled 2022-06-18 (×3): qty 40

## 2022-06-18 MED ORDER — CEFAZOLIN SODIUM-DEXTROSE 2-4 GM/100ML-% IV SOLN
2.0000 g | INTRAVENOUS | Status: AC
  Administered 2022-06-19: 2 g via INTRAVENOUS

## 2022-06-18 MED ORDER — ZIDOVUDINE 10 MG/ML IV SOLN
2.0000 mg/kg | Freq: Once | INTRAVENOUS | Status: AC
  Administered 2022-06-19: 155 mg via INTRAVENOUS
  Filled 2022-06-18: qty 15.5

## 2022-06-18 MED ORDER — SOD CITRATE-CITRIC ACID 500-334 MG/5ML PO SOLN
30.0000 mL | ORAL | Status: AC
  Administered 2022-06-19: 30 mL via ORAL
  Filled 2022-06-18: qty 30

## 2022-06-18 NOTE — H&P (Signed)
Megan Mcclain is a 36 y.o. female presenting for scheduled cesarean section s/s malpresentation. She is HIV positive and has had viral suppressed throughout entire pregnancy. She has been on dolutegravir and Descovy. 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 She is expecting a RR girl - "Megan Mcclain"!!     OB History     Gravida  1   Para      Term      Preterm      AB      Living         SAB      IAB      Ectopic      Multiple      Live Births             Past Medical History:  Diagnosis Date   Adverse effect of anesthesia    numbing medications do not last as long as they should   Allergy    Complication of anesthesia    allergic to novacaine   HIV (human immunodeficiency virus infection) (HCC)    Past Surgical History:  Procedure Laterality Date   WISDOM TOOTH EXTRACTION     Family History: family history includes Allergies in her brother and father; Diabetes in her mother; Heart failure in her paternal grandmother; Prostate cancer in her father; Stroke in her father. Social History:  reports that she has never smoked. She does not have any smokeless tobacco history on file. She reports current alcohol use. She reports that she does not use drugs.     Maternal Diabetes: No Genetic Screening: Normal Maternal Ultrasounds/Referrals: Normal Fetal Ultrasounds or other Referrals:  None Maternal Substance Abuse:  No Significant Maternal Medications:  Meds include: Other: dolutegravir and Descovy Significant Maternal Lab Results:  HIV positive Number of Prenatal Visits:greater than 3 verified prenatal visits Other Comments:  None  Review of Systems History   There were no vitals taken for this visit. Exam Physical Exam  (from office) NAD, A&O NWOB Abd soft, nondistended, gravid  Prenatal labs: ABO, Rh: A/Positive/-- (01/18 0000) Antibody: Negative (01/18 0000) Rubella: Immune (01/18  0000) RPR: Nonreactive (01/18 0000)  HBsAg: Negative (01/18 0000)  HIV: Reactive (01/18 0000)  GBS: Negative/-- (08/03 0000)   Assessment/Plan:  36 yo G1P0 presenting for scheduled primary CS s/s malpresentation at 14 wga. She is HIV positive and has been virally suppressed throughout entire pregnancy. Plan preop ZDV followed by continuous infusion. She understands risk of transmission of HIV. Breastfeeding CI. Risks discussed including infection, bleeding, damage to surrounding structures, the need for additional procedures including hysterectomy, and the possibility of uterine rupture with neonatal morbidity/mortality, scarring, and abnormal placentation with subsequent pregnancies. Mcclain agrees to proceed. 2g ancef on call to OR.     Ranae Pila 06/18/2022, 1:52 PM

## 2022-06-18 NOTE — H&P (Signed)
OB History and Physical   Megan Mcclain is a 36 y.o. female G1P0 presenting for SROM at [redacted]w[redacted]d.  Pregnancy has been complicated by malpresentation. She is scheduled for primary C section 8/22, now confirmed with PROM.  History is also notable for HSV on valtrex and HIV+ on dolutegravir and descovy. SMA testing revealed she is a carrier but FOB was negative.  She reports mild contractions, +FM, no VB.    OB History     Gravida  1   Para      Term      Preterm      AB      Living         SAB      IAB      Ectopic      Multiple      Live Births             Past Medical History:  Diagnosis Date   Adverse effect of anesthesia    numbing medications do not last as long as they should   Allergy    Complication of anesthesia    allergic to novacaine   HIV (human immunodeficiency virus infection) (HCC)    Past Surgical History:  Procedure Laterality Date   WISDOM TOOTH EXTRACTION     Family History: family history includes Allergies in her brother and father; Diabetes in her mother; Heart failure in her paternal grandmother; Prostate cancer in her father; Stroke in her father. Social History:  reports that she has never smoked. She does not have any smokeless tobacco history on file. She reports that she does not currently use alcohol. She reports that she does not use drugs.     Maternal Diabetes: No Genetic Screening: Normal Maternal Ultrasounds/Referrals: Normal Fetal Ultrasounds or other Referrals:  None Maternal Substance Abuse:  No Significant Maternal Medications:  dolutegravir, descovy, valtrex Significant Maternal Lab Results:  Group B Strep negative Other Comments:  None  Review of Systems - Patient denies fever, chills, SOB, CP, N/V/D.  History   Blood pressure 132/83, pulse 89, temperature 98.2 F (36.8 C), resp. rate 17, height 5\' 3"  (1.6 m), weight 81.6 kg, SpO2 98 %. Exam Physical Exam   Gen: alert, well appearing, no  distress Chest: nonlabored breathing CV: no peripheral edema Abdomen: soft, gravid  Ext: no evidence of DVT  Prenatal labs: ABO, Rh: A/Positive/-- (01/18 0000) Antibody: Negative (01/18 0000) Rubella: Immune (01/18 0000) RPR: Nonreactive (01/18 0000)  HBsAg: Negative (01/18 0000)  HIV: Reactive (01/18 0000)  GBS: Negative/-- (08/03 0000)   Assessment/Plan: Admit to Labor and Delivery Oblique position and SROM in MAU. Plan to proceed with primary C section for malpresentation HIV RNA quant ordered. She has previously been undetectable.  Prior plan in office is for intrapartum zidovudine infusion regardless. Continue dolutogravir and descovy as scheduled.  FHT reactive and reassuring Last ate 7:30 PM, will proceed as able.  11-04-1999 06/18/2022, 10:59 PM

## 2022-06-18 NOTE — MAU Note (Signed)
.  Megan Mcclain is a 36 y.o. at [redacted]w[redacted]d here in MAU reporting SROM at 2030. Clear fld. Good FM. For primary c/s Tues as baby is transverse. Last ate at 1930 -pretzel with cheese and cream soda. Mild ctxs  Onset of complaint: 1930 Pain score: 4 Vitals:   06/18/22 2110 06/18/22 2113  BP:  (!) 140/81  Pulse: 76   Resp: 17   Temp: 98.2 F (36.8 C)   SpO2: 100%      FHT:145 Lab orders placed from triage:  none

## 2022-06-18 NOTE — MAU Provider Note (Signed)
Chief Complaint:  Rupture of Membranes   Event Date/Time   First Provider Initiated Contact with Patient 06/18/22 2205   HPI: Megan Mcclain is a 36 y.o. G1P0 at 57w6dwho presents to maternity admissions reporting leaking of large amounts of fluid since .2030 She reports good fetal movement, denies vaginal bleeding, vaginal itching/burning, urinary symptoms, h/a, dizziness, n/v, diarrhea, constipation or fever/chills.  She denies headache, visual changes or RUQ abdominal pain.  Was scheduled for a C/S delivery next week due to Transverse Lie of fetus  Vaginal Discharge The patient's primary symptoms include vaginal discharge. The patient's pertinent negatives include no genital odor, pelvic pain or vaginal bleeding. This is a new problem. The current episode started today. The problem occurs constantly. The problem has been unchanged. Pertinent negatives include no chills, fever, headaches or vomiting. The vaginal discharge was clear and watery. There has been no bleeding. She has not been passing clots. She has not been passing tissue. Nothing aggravates the symptoms. She has tried nothing for the symptoms.    Past Medical History: Past Medical History:  Diagnosis Date   Adverse effect of anesthesia    numbing medications do not last as long as they should   Allergy    Complication of anesthesia    allergic to novacaine   HIV (human immunodeficiency virus infection) (HCC)     Past obstetric history: OB History  Gravida Para Term Preterm AB Living  1            SAB IAB Ectopic Multiple Live Births               # Outcome Date GA Lbr Len/2nd Weight Sex Delivery Anes PTL Lv  1 Current             Past Surgical History: Past Surgical History:  Procedure Laterality Date   WISDOM TOOTH EXTRACTION      Family History: Family History  Problem Relation Age of Onset   Diabetes Mother    Stroke Father    Prostate cancer Father    Allergies Father    Allergies Brother    Heart  failure Paternal Grandmother     Social History: Social History   Tobacco Use   Smoking status: Never  Vaping Use   Vaping Use: Never used  Substance Use Topics   Alcohol use: Not Currently   Drug use: No    Allergies:  Allergies  Allergen Reactions   Other Anaphylaxis and Other (See Comments)    Novocaine - anaphylaxis. Can have Lidocaine.   Vicodin [Hydrocodone-Acetaminophen] Nausea And Vomiting    Meds:  Medications Prior to Admission  Medication Sig Dispense Refill Last Dose   DESCOVY 200-25 MG tablet Take 1 tablet by mouth daily.   06/17/2022   TIVICAY 50 MG tablet Take 50 mg by mouth daily.   06/17/2022   valACYclovir (VALTREX) 1000 MG tablet Take 1,000 mg by mouth daily.   06/17/2022   triamcinolone (NASACORT ALLERGY 24HR) 55 MCG/ACT AERO nasal inhaler Place 1 spray into the nose daily as needed (allergies).       I have reviewed patient's Past Medical Hx, Surgical Hx, Family Hx, Social Hx, medications and allergies.   ROS:  Review of Systems  Constitutional:  Negative for chills and fever.  Gastrointestinal:  Negative for vomiting.  Genitourinary:  Positive for vaginal discharge. Negative for pelvic pain.  Neurological:  Negative for headaches.   Other systems negative  Physical Exam  Patient Vitals for the past  24 hrs:  BP Temp Pulse Resp SpO2 Height Weight  06/18/22 2113 (!) 140/81 -- -- -- -- -- --  06/18/22 2110 -- 98.2 F (36.8 C) 76 17 100 % 5\' 3"  (1.6 m) 81.6 kg   Constitutional: Well-developed, well-nourished female in no acute distress.  Cardiovascular: normal rate and rhythm Respiratory: normal effort GI: Abd soft, non-tender, gravid appropriate for gestational age.   No rebound or guarding. MS: Extremities nontender, no edema, normal ROM Neurologic: Alert and oriented x 4.  GU: Neg CVAT.  PELVIC EXAM:  Sterile Speculum Exam: Gross pooling of large amount of clear amniotic fluid.  Presentation:  (bedside u/s per Lyrick Worland, cnm shows oblique  nearly vertex)  FHT:  Baseline 130 , moderate variability, accelerations present, no decelerations Contractions: q 3-6 mins Irregular     Labs: No results found for this or any previous visit (from the past 24 hour(s)).  A/Positive/-- (01/18 0000)  Imaging:  BEDSIDE ULTRASOUND FOR PRESENTATION/POSITION Pt informed that the ultrasound is considered a limited OB ultrasound and is not intended to be a complete ultrasound exam.  Patient also informed that the ultrasound is not being completed with the intent of assessing for fetal or placental anomalies or any pelvic abnormalities.  Explained that the purpose of today's ultrasound is to assess for presentation  Patient acknowledges the purpose of the exam and the limitations of the study.    Fetus is in the Longitudinal Lie Fetal head is in a Left Oblique/near-vertex position.  MAU Course/MDM: I have reviewed the triage vital signs and the nursing notes.   Pertinent labs & imaging results that were available during my care of the patient were reviewed by me and considered in my medical decision making (see chart for details).      I have reviewed her medical records including past results, notes and treatments.   NST reviewed, reactive, Category I Consult Dr [id number] by RN with presentation, exam findings and test results  Treatments in MAU included EFM, Sterile Speculum Exam and Ultrasound.    Assessment: Single IUP at [redacted]w[redacted]d Premature Rupture of Membranes Oblique Lie with Near Vertex position  Plan: Admit  Routine orders Dr [redacted]w[redacted]d will come evaluate patient for plan of care   Lorane Gell CNM, MSN Certified Nurse-Midwife 06/18/2022 10:23 PM

## 2022-06-19 ENCOUNTER — Inpatient Hospital Stay (HOSPITAL_COMMUNITY): Payer: Managed Care, Other (non HMO) | Admitting: Anesthesiology

## 2022-06-19 ENCOUNTER — Encounter (HOSPITAL_COMMUNITY): Payer: Self-pay | Admitting: Obstetrics and Gynecology

## 2022-06-19 ENCOUNTER — Encounter (HOSPITAL_COMMUNITY)
Admission: AD | Disposition: A | Discharge status: Home / Self Care | Payer: Self-pay | Source: Home / Self Care | Attending: Obstetrics and Gynecology

## 2022-06-19 DIAGNOSIS — O9872 Human immunodeficiency virus [HIV] disease complicating childbirth: Secondary | ICD-10-CM

## 2022-06-19 DIAGNOSIS — Z21 Asymptomatic human immunodeficiency virus [HIV] infection status: Secondary | ICD-10-CM

## 2022-06-19 DIAGNOSIS — Z3A39 39 weeks gestation of pregnancy: Secondary | ICD-10-CM

## 2022-06-19 DIAGNOSIS — O322XX0 Maternal care for transverse and oblique lie, not applicable or unspecified: Secondary | ICD-10-CM

## 2022-06-19 LAB — TYPE AND SCREEN
ABO/RH(D): A POS
Antibody Screen: NEGATIVE

## 2022-06-19 LAB — RPR: RPR Ser Ql: NONREACTIVE

## 2022-06-19 SURGERY — Surgical Case
Anesthesia: Spinal

## 2022-06-19 MED ORDER — DEXAMETHASONE SODIUM PHOSPHATE 10 MG/ML IJ SOLN
INTRAMUSCULAR | Status: DC | PRN
  Administered 2022-06-19: 10 mg via INTRAVENOUS

## 2022-06-19 MED ORDER — MENTHOL 3 MG MT LOZG
1.0000 | LOZENGE | OROMUCOSAL | Status: DC | PRN
Start: 2022-06-19 — End: 2022-06-21

## 2022-06-19 MED ORDER — NALOXONE HCL 4 MG/10ML IJ SOLN: 1.0000 ug/kg/h | INTRAVENOUS | Status: DC | PRN

## 2022-06-19 MED ORDER — IBUPROFEN 600 MG PO TABS
600.0000 mg | ORAL_TABLET | Freq: Four times a day (QID) | ORAL | Status: DC
  Filled 2022-06-19: qty 1

## 2022-06-19 MED ORDER — DIBUCAINE (PERIANAL) 1 % EX OINT: 1.0000 | TOPICAL_OINTMENT | CUTANEOUS | Status: DC | PRN

## 2022-06-19 MED ORDER — MORPHINE SULFATE (PF) 0.5 MG/ML IJ SOLN
INTRAMUSCULAR | Status: AC
  Filled 2022-06-19: qty 10

## 2022-06-19 MED ORDER — ACETAMINOPHEN 10 MG/ML IV SOLN
INTRAVENOUS | Status: DC | PRN
  Administered 2022-06-19: 1000 mg via INTRAVENOUS

## 2022-06-19 MED ORDER — MEPERIDINE HCL 25 MG/ML IJ SOLN: 6.2500 mg | INTRAMUSCULAR | Status: DC | PRN

## 2022-06-19 MED ORDER — FENTANYL CITRATE (PF) 100 MCG/2ML IJ SOLN
INTRAMUSCULAR | Status: AC
  Filled 2022-06-19: qty 2

## 2022-06-19 MED ORDER — OXYTOCIN-SODIUM CHLORIDE 30-0.9 UT/500ML-% IV SOLN
2.5000 [IU]/h | INTRAVENOUS | Status: AC
  Administered 2022-06-19: 2.5 [IU]/h via INTRAVENOUS
  Filled 2022-06-19: qty 500

## 2022-06-19 MED ORDER — LACTATED RINGERS IV SOLN: INTRAVENOUS | Status: DC

## 2022-06-19 MED ORDER — MORPHINE SULFATE (PF) 0.5 MG/ML IJ SOLN
INTRAMUSCULAR | Status: DC | PRN
  Administered 2022-06-19: 150 ug via EPIDURAL

## 2022-06-19 MED ORDER — DIPHENHYDRAMINE HCL 25 MG PO CAPS: 25.0000 mg | ORAL_CAPSULE | Freq: Four times a day (QID) | ORAL | Status: DC | PRN

## 2022-06-19 MED ORDER — COCONUT OIL OIL: 1.0000 | TOPICAL_OIL | Status: DC | PRN

## 2022-06-19 MED ORDER — OXYCODONE HCL 5 MG/5ML PO SOLN: 5.0000 mg | Freq: Once | ORAL | Status: DC | PRN

## 2022-06-19 MED ORDER — BUPIVACAINE IN DEXTROSE 0.75-8.25 % IT SOLN
INTRATHECAL | Status: DC | PRN
  Administered 2022-06-19: 1.6 mL via INTRATHECAL

## 2022-06-19 MED ORDER — AMISULPRIDE (ANTIEMETIC) 5 MG/2ML IV SOLN: 10.0000 mg | Freq: Once | INTRAVENOUS | Status: DC | PRN

## 2022-06-19 MED ORDER — FENTANYL CITRATE (PF) 100 MCG/2ML IJ SOLN
INTRAMUSCULAR | Status: DC | PRN
  Administered 2022-06-19: 15 ug via INTRAVENOUS

## 2022-06-19 MED ORDER — PHENYLEPHRINE HCL-NACL 20-0.9 MG/250ML-% IV SOLN
INTRAVENOUS | Status: DC | PRN
  Administered 2022-06-19: 60 ug/min via INTRAVENOUS

## 2022-06-19 MED ORDER — OXYCODONE HCL 5 MG PO TABS: 5.0000 mg | ORAL_TABLET | ORAL | Status: DC | PRN

## 2022-06-19 MED ORDER — PROMETHAZINE HCL 25 MG/ML IJ SOLN: 6.2500 mg | INTRAMUSCULAR | Status: DC | PRN

## 2022-06-19 MED ORDER — ONDANSETRON HCL 4 MG/2ML IJ SOLN
INTRAMUSCULAR | Status: DC | PRN
  Administered 2022-06-19: 4 mg via INTRAVENOUS

## 2022-06-19 MED ORDER — DOLUTEGRAVIR SODIUM 50 MG PO TABS
50.0000 mg | ORAL_TABLET | Freq: Every day | ORAL | Status: DC
  Administered 2022-06-19 – 2022-06-20 (×2): 50 mg via ORAL
  Filled 2022-06-19 (×3): qty 1

## 2022-06-19 MED ORDER — ACETAMINOPHEN 325 MG PO TABS
650.0000 mg | ORAL_TABLET | ORAL | Status: DC | PRN
  Administered 2022-06-19 – 2022-06-21 (×6): 650 mg via ORAL
  Filled 2022-06-19 (×7): qty 2

## 2022-06-19 MED ORDER — HYDROMORPHONE HCL 1 MG/ML IJ SOLN
0.2500 mg | INTRAMUSCULAR | Status: DC | PRN
  Administered 2022-06-19: 0.5 mg via INTRAVENOUS

## 2022-06-19 MED ORDER — PRENATAL MULTIVITAMIN CH
1.0000 | ORAL_TABLET | Freq: Every day | ORAL | Status: DC
  Administered 2022-06-19: 1 via ORAL
  Filled 2022-06-19: qty 1

## 2022-06-19 MED ORDER — SIMETHICONE 80 MG PO CHEW: 80.0000 mg | CHEWABLE_TABLET | ORAL | Status: DC | PRN

## 2022-06-19 MED ORDER — ZOLPIDEM TARTRATE 5 MG PO TABS: 5.0000 mg | ORAL_TABLET | Freq: Every evening | ORAL | Status: DC | PRN

## 2022-06-19 MED ORDER — TETANUS-DIPHTH-ACELL PERTUSSIS 5-2.5-18.5 LF-MCG/0.5 IM SUSY: 0.5000 mL | PREFILLED_SYRINGE | Freq: Once | INTRAMUSCULAR | Status: DC

## 2022-06-19 MED ORDER — NALOXONE HCL 0.4 MG/ML IJ SOLN: 0.4000 mg | INTRAMUSCULAR | Status: DC | PRN

## 2022-06-19 MED ORDER — SENNOSIDES-DOCUSATE SODIUM 8.6-50 MG PO TABS
2.0000 | ORAL_TABLET | Freq: Every day | ORAL | Status: DC
  Administered 2022-06-20 – 2022-06-21 (×2): 2 via ORAL
  Filled 2022-06-19 (×2): qty 2

## 2022-06-19 MED ORDER — DIPHENHYDRAMINE HCL 50 MG/ML IJ SOLN: 12.5000 mg | INTRAMUSCULAR | Status: DC | PRN

## 2022-06-19 MED ORDER — OXYCODONE HCL 5 MG PO TABS: 5.0000 mg | ORAL_TABLET | Freq: Once | ORAL | Status: DC | PRN

## 2022-06-19 MED ORDER — WITCH HAZEL-GLYCERIN EX PADS: 1.0000 | MEDICATED_PAD | CUTANEOUS | Status: DC | PRN

## 2022-06-19 MED ORDER — DIPHENHYDRAMINE HCL 25 MG PO CAPS: 25.0000 mg | ORAL_CAPSULE | ORAL | Status: DC | PRN

## 2022-06-19 MED ORDER — EMTRICITABINE-TENOFOVIR AF 200-25 MG PO TABS
1.0000 | ORAL_TABLET | Freq: Every day | ORAL | Status: DC
  Filled 2022-06-19: qty 1

## 2022-06-19 MED ORDER — SIMETHICONE 80 MG PO CHEW
80.0000 mg | CHEWABLE_TABLET | Freq: Three times a day (TID) | ORAL | Status: DC
  Administered 2022-06-19 – 2022-06-21 (×6): 80 mg via ORAL
  Filled 2022-06-19 (×6): qty 1

## 2022-06-19 MED ORDER — HYDROMORPHONE HCL 1 MG/ML IJ SOLN
INTRAMUSCULAR | Status: AC
  Filled 2022-06-19: qty 1

## 2022-06-19 MED ORDER — EMTRICITABINE-TENOFOVIR AF 200-25 MG PO TABS
1.0000 | ORAL_TABLET | Freq: Every day | ORAL | Status: DC
  Administered 2022-06-19 – 2022-06-20 (×2): 1 via ORAL
  Filled 2022-06-19 (×3): qty 1

## 2022-06-19 MED ORDER — SODIUM CHLORIDE 0.9% FLUSH: 3.0000 mL | INTRAVENOUS | Status: DC | PRN

## 2022-06-19 MED ORDER — OXYTOCIN-SODIUM CHLORIDE 30-0.9 UT/500ML-% IV SOLN
INTRAVENOUS | Status: DC | PRN
  Administered 2022-06-19: 30 [IU] via INTRAVENOUS

## 2022-06-19 MED ORDER — TRAMADOL HCL 50 MG PO TABS
50.0000 mg | ORAL_TABLET | Freq: Four times a day (QID) | ORAL | Status: DC | PRN
  Administered 2022-06-19 – 2022-06-21 (×7): 50 mg via ORAL
  Filled 2022-06-19 (×7): qty 1

## 2022-06-19 SURGICAL SUPPLY — 34 items
BENZOIN TINCTURE PRP APPL 2/3 (GAUZE/BANDAGES/DRESSINGS) IMPLANT
CHLORAPREP W/TINT 26ML (MISCELLANEOUS) ×2 IMPLANT
CLAMP CORD UMBIL (MISCELLANEOUS) ×1 IMPLANT
CLOSURE STERI STRIP 1/2 X4 (GAUZE/BANDAGES/DRESSINGS) IMPLANT
CLOTH BEACON ORANGE TIMEOUT ST (SAFETY) ×1 IMPLANT
DRSG OPSITE POSTOP 4X10 (GAUZE/BANDAGES/DRESSINGS) ×1 IMPLANT
ELECT REM PT RETURN 9FT ADLT (ELECTROSURGICAL) ×1
ELECTRODE REM PT RTRN 9FT ADLT (ELECTROSURGICAL) ×1 IMPLANT
EXTRACTOR VACUUM M CUP 4 TUBE (SUCTIONS) IMPLANT
GLOVE BIOGEL PI IND STRL 7.0 (GLOVE) ×2 IMPLANT
GLOVE BIOGEL PI IND STRL 7.5 (GLOVE) ×1 IMPLANT
GLOVE BIOGEL PI INDICATOR 7.0 (GLOVE) ×2
GLOVE BIOGEL PI INDICATOR 7.5 (GLOVE) ×1
GLOVE ECLIPSE 7.0 STRL STRAW (GLOVE) ×1 IMPLANT
GOWN STRL REUS W/TWL LRG LVL3 (GOWN DISPOSABLE) ×2 IMPLANT
HEMOSTAT ARISTA ABSORB 3G PWDR (HEMOSTASIS) IMPLANT
KIT ABG SYR 3ML LUER SLIP (SYRINGE) IMPLANT
NDL HYPO 25X5/8 SAFETYGLIDE (NEEDLE) IMPLANT
NEEDLE HYPO 25X5/8 SAFETYGLIDE (NEEDLE) IMPLANT
NS IRRIG 1000ML POUR BTL (IV SOLUTION) ×1 IMPLANT
PACK C SECTION WH (CUSTOM PROCEDURE TRAY) ×1 IMPLANT
PAD ABD 7.5X8 STRL (GAUZE/BANDAGES/DRESSINGS) IMPLANT
PAD OB MATERNITY 4.3X12.25 (PERSONAL CARE ITEMS) ×1 IMPLANT
SPONGE GAUZE 4X4 12PLY STER LF (GAUZE/BANDAGES/DRESSINGS) IMPLANT
STRIP CLOSURE SKIN 1/2X4 (GAUZE/BANDAGES/DRESSINGS) IMPLANT
SUT PDS AB 0 CTX 60 (SUTURE) ×2 IMPLANT
SUT PLAIN 0 NONE (SUTURE) IMPLANT
SUT VIC AB 0 CT1 36 (SUTURE) IMPLANT
SUT VIC AB 0 CTX 36 (SUTURE) ×5
SUT VIC AB 0 CTX36XBRD ANBCTRL (SUTURE) ×4 IMPLANT
SUT VIC AB 4-0 KS 27 (SUTURE) ×1 IMPLANT
TOWEL OR 17X24 6PK STRL BLUE (TOWEL DISPOSABLE) ×1 IMPLANT
TRAY FOLEY W/BAG SLVR 14FR LF (SET/KITS/TRAYS/PACK) ×1 IMPLANT
WATER STERILE IRR 1000ML POUR (IV SOLUTION) ×1 IMPLANT

## 2022-06-19 NOTE — Anesthesia Postprocedure Evaluation (Signed)
Anesthesia Post Note  Patient: Megan Mcclain  Procedure(s) Performed: CESAREAN SECTION (canceled)     Patient location during evaluation: PACU Anesthesia Type: Spinal Level of consciousness: awake and alert Pain management: pain level controlled Vital Signs Assessment: post-procedure vital signs reviewed and stable Respiratory status: spontaneous breathing, nonlabored ventilation and respiratory function stable Cardiovascular status: blood pressure returned to baseline and stable Postop Assessment: no apparent nausea or vomiting Anesthetic complications: no   No notable events documented.  Last Vitals:  Vitals:   06/19/22 0515 06/19/22 0530  BP: 108/67 122/70  Pulse: 70 70  Resp: 17 14  Temp:    SpO2: 99% 97%    Last Pain:  Vitals:   06/19/22 0515  TempSrc:   PainSc: 0-No pain   Pain Goal: Patients Stated Pain Goal: 0 (06/18/22 2116)  LLE Motor Response: Purposeful movement (06/19/22 0530) LLE Sensation: Tingling (06/19/22 0515) RLE Motor Response: Purposeful movement (06/19/22 0530) RLE Sensation: Tingling (06/19/22 0515) L Sensory Level: T10-Umbilical region (06/19/22 0515) R Sensory Level: T10-Umbilical region (06/19/22 0515) Epidural/Spinal Function Cutaneous sensation: Tingles (06/19/22 0515), Patient able to flex knees: No (06/19/22 0515), Patient able to lift hips off bed: No (06/19/22 0515), Back pain beyond tenderness at insertion site: No (06/19/22 0515), Progressively worsening motor and/or sensory loss: No (06/19/22 0515), Bowel and/or bladder incontinence post epidural: No (06/19/22 0515)  Lowella Curb

## 2022-06-19 NOTE — Op Note (Signed)
Operative Note  PROCEDURE DATE: 06/19/22   PREOPERATIVE DIAGNOSIS: malpresentation, PROM, HIV+   POSTOPERATIVE DIAGNOSIS: The same   PROCEDURE:    Primary Low Transverse Cesarean Section   SURGEON:  Dr. Austin Gloria Lambertson   INDICATIONS: This is a Megan Mcclain at [redacted]w[redacted]d requiring cesarean section secondary to PROM and malpresentation. Baby has been breech or transverse in the office, upon presentation baby in oblique presentation.   Decision made to proceed with LTCS. The risks of cesarean section discussed with the patient included but were not limited to: bleeding which may require transfusion or reoperation; infection which may require antibiotics; injury to bowel, bladder, ureters or other surrounding organs; injury to the fetus; need for additional procedures including hysterectomy in the event of a life-threatening hemorrhage; placental abnormalities wth subsequent pregnancies, incisional problems, thromboembolic phenomenon and other postoperative/anesthesia complications. The patient agreed with the proposed plan, giving informed consent for the procedure.     FINDINGS:  Viable female infant in oblique > vertex presentation, APGARs 9, 9,  Weight pending, Amniotic fluid clear,  Intact placenta, three vessel cord.  Grossly normal uterus .   ANESTHESIA:    Epidural ESTIMATED BLOOD LOSS: Per record SPECIMENS: Placenta for routine COMPLICATIONS: None immediate    PROCEDURE IN DETAIL:  The patient received intravenous antibiotics (Ancef and azithromycin for ROM) and had sequential compression devices applied to her lower extremities while in the preoperative area.  She was then taken to the operating room where epidural anesthesia was dosed up to surgical level and was found to be adequate. She was then placed in a dorsal supine position with a leftward tilt, and prepped and draped in a sterile manner.  A foley catheter was placed into her bladder and attached to constant gravity.  After an  adequate timeout was performed, a Pfannenstiel skin incision was made with scalpel and carried through to the underlying layer of fascia. The fascia was incised in the midline and this incision was extended bilaterally using the Mayo scissors. Kocher clamps were applied to the superior aspect of the fascial incision and the underlying rectus muscles were dissected off bluntly. A similar process was carried out on the inferior aspect of the facial incision. The rectus muscles were separated in the midline bluntly and the peritoneum was entered bluntly.  A bladder flap was created sharply and developed bluntly. A transverse hysterotomy was made with a scalpel and extended bilaterally bluntly. The bladder blade was then removed. The infant was successfully delivered, and cord was clamped and cut and infant was handed over to awaiting neonatology team. Uterine massage was then administered and the placenta delivered intact with three-vessel cord. The uterus was cleared of clot and debris.  The hysterotomy was closed with 0 vicryl.  A second imbricating suture of 0-vicryl was used to reinforce the incision and aid in hemostasis.The fascia was closed with 0-Vicryl in a running fashion with good restoration of anatomy.  The subcutaneus tissue was irrigated and was reapproximated using running plain gut.  The skin was closed with 4-0 Vicryl in a subcuticular fashion.  All surgical site and was hemostatic at end of procedure without any further bleeding on exam.    Pt tolerated the procedure well. All sponge/lap/needle counts were correct  X 2. Pt taken to recovery room in stable condition.     Austin Kerington Hildebrant MD  

## 2022-06-19 NOTE — Progress Notes (Addendum)
Patient stated that she was having pain and requested pain medication. I went over which medication that she could take. Patient stated she could not take Ibuprofen due to her specialty medication that she takes and she could not take Oxycodone due to her allergy to Novocaine. Sent provider a message requesting another form/type of pain medication for the patient. Currently waiting on response from provider.  Update provider replied back stating, "I added that order.  The oxycodone would be more related to her hydrocodone allergy, which I think is nausea as the reaction.  She may tolerate tramadol much better." Tramadol has been ordered for the patient.

## 2022-06-19 NOTE — Transfer of Care (Signed)
Immediate Anesthesia Transfer of Care Note  Patient: Megan Mcclain  Procedure(s) Performed: CESAREAN SECTION  Patient Location: PACU  Anesthesia Type:Spinal  Level of Consciousness: awake, alert , oriented and patient cooperative  Airway & Oxygen Therapy: Patient Spontanous Breathing  Post-op Assessment: Report given to RN and Post -op Vital signs reviewed and stable  Post vital signs: Reviewed and stable  Last Vitals:  Vitals Value Taken Time  BP    Temp    Pulse 78 06/19/22 0455  Resp 19 06/19/22 0455  SpO2 97 % 06/19/22 0455  Vitals shown include unvalidated device data.  Last Pain:  Vitals:   06/18/22 2116  PainSc: 4       Patients Stated Pain Goal: 0 (06/18/22 2116)  Complications: No notable events documented.

## 2022-06-19 NOTE — Anesthesia Preprocedure Evaluation (Signed)
Anesthesia Evaluation  Patient identified by MRN, date of birth, ID band Patient awake    Reviewed: Allergy & Precautions, NPO status , Patient's Chart, lab work & pertinent test results  Airway Mallampati: II  TM Distance: >3 FB Neck ROM: Full    Dental no notable dental hx.    Pulmonary neg pulmonary ROS,    Pulmonary exam normal breath sounds clear to auscultation       Cardiovascular negative cardio ROS Normal cardiovascular exam Rhythm:Regular Rate:Normal     Neuro/Psych negative neurological ROS  negative psych ROS   GI/Hepatic negative GI ROS, Neg liver ROS,   Endo/Other  negative endocrine ROS  Renal/GU negative Renal ROS  negative genitourinary   Musculoskeletal negative musculoskeletal ROS (+)   Abdominal   Peds negative pediatric ROS (+)  Hematology negative hematology ROS (+)   Anesthesia Other Findings   Reproductive/Obstetrics negative OB ROS                             Anesthesia Physical Anesthesia Plan  ASA: 2  Anesthesia Plan: Spinal   Post-op Pain Management:    Induction: Intravenous  PONV Risk Score and Plan: 2 and Ondansetron and Treatment may vary due to age or medical condition  Airway Management Planned: Natural Airway  Additional Equipment:   Intra-op Plan:   Post-operative Plan:   Informed Consent: I have reviewed the patients History and Physical, chart, labs and discussed the procedure including the risks, benefits and alternatives for the proposed anesthesia with the patient or authorized representative who has indicated his/her understanding and acceptance.     Dental advisory given  Plan Discussed with: CRNA  Anesthesia Plan Comments:         Anesthesia Quick Evaluation

## 2022-06-19 NOTE — Anesthesia Procedure Notes (Signed)
Spinal  Patient location during procedure: OB Start time: 06/19/2022 3:14 AM End time: 06/19/2022 3:19 AM Reason for block: surgical anesthesia Staffing Performed: anesthesiologist  Anesthesiologist: Lowella Curb, MD Performed by: Lowella Curb, MD Authorized by: Lowella Curb, MD   Preanesthetic Checklist Completed: patient identified, IV checked, risks and benefits discussed, surgical consent, monitors and equipment checked, pre-op evaluation and timeout performed Spinal Block Patient position: sitting Prep: DuraPrep and site prepped and draped Patient monitoring: heart rate, cardiac monitor, continuous pulse ox and blood pressure Approach: midline Location: L3-4 Injection technique: single-shot Needle Needle type: Pencan  Needle gauge: 24 G Needle length: 10 cm Assessment Sensory level: T4 Events: CSF return

## 2022-06-20 LAB — CBC
HCT: 21.8 % — ABNORMAL LOW (ref 36.0–46.0)
Hemoglobin: 7.2 g/dL — ABNORMAL LOW (ref 12.0–15.0)
MCH: 34.8 pg — ABNORMAL HIGH (ref 26.0–34.0)
MCHC: 33 g/dL (ref 30.0–36.0)
MCV: 105.3 fL — ABNORMAL HIGH (ref 80.0–100.0)
Platelets: 265 10*3/uL (ref 150–400)
RBC: 2.07 MIL/uL — ABNORMAL LOW (ref 3.87–5.11)
RDW: 13.2 % (ref 11.5–15.5)
WBC: 14.8 10*3/uL — ABNORMAL HIGH (ref 4.0–10.5)
nRBC: 0 % (ref 0.0–0.2)

## 2022-06-20 LAB — HIV-1 RNA QUANT-NO REFLEX-BLD
HIV 1 RNA Quant: <20 copies/mL
LOG10 HIV-1 RNA: UNDETERMINED log10copy/mL

## 2022-06-20 NOTE — Progress Notes (Signed)
CSW received consult due to MOB's HIV+ status. CSW met with MOB to offer support and complete assessment. When CSW entered room, MOB's mother was present. Infant was observed sleeping on back in bassinet. CSW asked to speak with MOB alone to ensure confidentiality. MOB's mother left the room. CSW introduced self and reason for consult. MOB presented as polite and remained engaged throughout the encounter.  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 Per chart review, MOB has an undetectable viral load since 4 months after her diagnosis 7 years ago. Per chart review, MOB and infant have received AZT treatment throughout labor/delivery. CSW informed MOB that infant will need to follow up with a pediatric infectious disease clinic. MOB chose to follow up with [facility]. CSW to fax referral to [facility] for infant follow up. MOB was provided with contact information to [facility].   CSW inquired about MOB's mental health history. MOB denied a history of mental health diagnoses. Per chart review, MOB has a history of anxiety and panic attacks and was prescribed alprazolam prior to pregnancy. However, MOB did not mention history of anxiety during consult. MOB identified her husband/FOB, mother, and friends as supports. MOB did not demonstrate any acute mental health signs/symptoms. MOB denied current SI/HI/DV.  MOB reports she has all needed items for infant, including a car seat and bassinet. MOB has chosen [facility] as infant's pediatrician office.  CSW provided education regarding the baby blues period vs. perinatal mood disorders, discussed treatment and offered resources for mental health follow up if concerns arise.  CSW recommends self-evaluation during the postpartum time period using the New Mom Checklist from Postpartum Progress and encouraged MOB to contact a medical professional if symptoms are noted at any time.    CSW provided review of Sudden Infant Death Syndrome (SIDS)  precautions.    CSW identifies no further need for intervention and no barriers to discharge at this time.  Signed,  Berniece Salines, MSW, LCSWA, LCASA 06/20/2022 3:48 PM

## 2022-06-20 NOTE — Addendum Note (Signed)
Addendum  created 06/20/22 1440 by Renford Dills, CRNA   Intraprocedure Event edited

## 2022-06-20 NOTE — Progress Notes (Signed)
Subjective: Postpartum Day 1: Cesarean Delivery for malpresentation and PROM, HIV + virally suppressed. Received intrapartum ZDV.  Patient reports tolerating PO and no problems voiding.  Reports pain well controlled with tylenol and tramadol.   Objective: Vital signs in last 24 hours: Temp:  [98.1 F (36.7 C)-99.3 F (37.4 C)] 99.3 F (37.4 C) (08/19 0529) Pulse Rate:  [76-98] 98 (08/19 0529) Resp:  [18-20] 18 (08/19 0529) BP: (110-127)/(71-81) 110/80 (08/19 0529) SpO2:  [98 %-100 %] 98 % (08/18 1717)  Physical Exam:  General: alert Lochia: appropriate Uterine Fundus: firm Incision: healing well DVT Evaluation: No evidence of DVT seen on physical exam.  Recent Labs    06/18/22 2255 06/20/22 0446  HGB 10.3* 7.2*  HCT 29.3* 21.8*    Assessment/Plan: Status post Cesarean section. Doing well postoperatively.  RNA in process - has been virally suppressed entire pregnancy.  Continue dolutogravir and descovy.  Bottle feeding [name] given HIV +.   Ranae Pila, MD 06/20/2022, 8:59 AM

## 2022-06-21 MED ORDER — DOCUSATE SODIUM 100 MG PO CAPS
100.0000 mg | ORAL_CAPSULE | Freq: Two times a day (BID) | ORAL | 2 refills | Status: AC
Start: 2022-06-21 — End: ?

## 2022-06-21 MED ORDER — TRAMADOL HCL 50 MG PO TABS: 50.0000 mg | ORAL_TABLET | Freq: Four times a day (QID) | ORAL | 0 refills | Status: AC | PRN

## 2022-06-21 NOTE — Discharge Summary (Signed)
Postpartum Discharge Summary  Date of Service updated 06/21/22     Patient Name: Megan Mcclain DOB: 1986-02-20 MRN: [id number]  Date of admission: 06/18/2022 Delivery date:06/19/2022  Delivering provider: Eyvonne Mechanic A  Date of discharge: 06/21/2022  Admitting diagnosis: Pregnancy [Z34.90] Intrauterine pregnancy: [redacted]w[redacted]d    Secondary diagnosis:  Principal Problem:   Pregnancy  Additional problems: HIV +    Discharge diagnosis: Term Pregnancy Delivered                                              Post partum procedures: None Augmentation: N/A Complications: None  Hospital course: Sceduled C/S   36y.o. yo G1P0 at 370w2das admitted to the hospital 06/18/2022 for scheduled cesarean section with the following indication:Malpresentation and PROM, HIV + with viral suppression .Delivery details are as follows: received ADV.  Membrane Rupture Time/Date: 8:30 PM ,06/18/2022   Delivery Method:C-Section, Low Transverse  Details of operation can be found in separate operative note.  Patient had an uncomplicated postpartum course.  She is ambulating, tolerating a regular diet, passing flatus, and urinating well. Patient is discharged home in stable condition on  06/21/22        Newborn Data: Birth date:06/19/2022  Birth time:3:51 AM  Gender:Female  Living status:Living  Apgars:9 ,9  Weight:3370 g     Magnesium Sulfate received: No BMZ received: No Rhophylac:N/A MMR:N/A T-DaP:Given prenatally Flu: N/A Transfusion:No  Physical exam  Vitals:   06/20/22 0529 06/20/22 1548 06/20/22 2040 06/21/22 0500  BP: 110/80 133/87 134/80 131/80  Pulse: 98 90 100 92  Resp: 18 18 18    Temp: 99.3 F (37.4 C) 97.7 F (36.5 C) 98.6 F (37 C) 98.3 F (36.8 C)  TempSrc: Oral Oral Oral Oral  SpO2:  100% 99% 100%  Weight:      Height:       General: alert, cooperative, and no distress Lochia: appropriate Uterine Fundus: firm Incision: Healing well with no significant drainage DVT  Evaluation: No evidence of DVT seen on physical exam. Labs: Lab Results  Component Value Date   WBC 14.8 (H) 06/20/2022   HGB 7.2 (L) 06/20/2022   HCT 21.8 (L) 06/20/2022   MCV 105.3 (H) 06/20/2022   PLT 265 06/20/2022      Latest Ref Rng & Units 11/18/2016    8:40 AM  CMP  Glucose 65 - 99 mg/dL 99   BUN 6 - 20 mg/dL 18   Creatinine 0.44 - 1.00 mg/dL 0.74   Sodium 135 - 145 mmol/L 136   Potassium 3.5 - 5.1 mmol/L 3.6   Chloride 101 - 111 mmol/L 104   CO2 22 - 32 mmol/L 23   Calcium 8.9 - 10.3 mg/dL 9.3   Total Protein 6.5 - 8.1 g/dL 7.8   Total Bilirubin 0.3 - 1.2 mg/dL 0.8   Alkaline Phos 38 - 126 U/L 49   AST 15 - 41 U/L 18   ALT 14 - 54 U/L 9    Edinburgh Score:    06/20/2022    3:00 AM  Edinburgh Postnatal Depression Scale Screening Tool  I have been able to laugh and see the funny side of things. 0  I have looked forward with enjoyment to things. 0  I have blamed myself unnecessarily when things went wrong. 0  I have been anxious or worried for no  good reason. 1  I have felt scared or panicky for no good reason. 1  Things have been getting on top of me. 0  I have been so unhappy that I have had difficulty sleeping. 0  I have felt sad or miserable. 0  I have been so unhappy that I have been crying. 0  The thought of harming myself has occurred to me. 0  Edinburgh Postnatal Depression Scale Total 2      After visit meds:  Allergies as of 06/21/2022       Reactions   Other Anaphylaxis, Other (See Comments)   Novocaine - anaphylaxis. Can have Lidocaine.   Vicodin [hydrocodone-acetaminophen] Nausea And Vomiting        Medication List     TAKE these medications    Descovy 200-25 MG tablet Generic drug: emtricitabine-tenofovir AF Take 1 tablet by mouth daily.   docusate sodium 100 MG capsule Commonly known as: Colace Take 1 capsule (100 mg total) by mouth 2 (two) times daily.   Nasacort Allergy 24HR 55 MCG/ACT Aero nasal inhaler Generic drug:  triamcinolone Place 1 spray into the nose daily as needed (allergies).   Tivicay 50 MG tablet Generic drug: dolutegravir Take 50 mg by mouth daily.   traMADol 50 MG tablet Commonly known as: ULTRAM Take 1 tablet (50 mg total) by mouth every 6 (six) hours as needed for moderate pain.   valACYclovir 1000 MG tablet Commonly known as: VALTREX Take 1,000 mg by mouth daily.         Discharge home in stable condition Infant Feeding: Bottle Infant Disposition:home with mother Discharge instruction: per After Visit Summary and Postpartum booklet. Activity: Advance as tolerated. Pelvic rest for 6 weeks.  Diet: routine diet Anticipated Birth Control: Unsure Postpartum Appointment:6 weeks Additional Postpartum F/U:  six weeks Future Appointments:No future appointments. Follow up Visit:      06/21/2022 Tyson Dense, MD

## 2022-06-22 ENCOUNTER — Other Ambulatory Visit (HOSPITAL_COMMUNITY)
Admission: RE | Admit: 2022-06-22 | Discharge: 2022-06-22 | Disposition: A | Discharge status: Home / Self Care | Payer: Managed Care, Other (non HMO) | Source: Ambulatory Visit | Attending: Obstetrics and Gynecology | Admitting: Obstetrics and Gynecology

## 2022-06-22 HISTORY — DX: Human immunodeficiency virus (HIV) disease: B20

## 2022-06-22 HISTORY — DX: Asymptomatic human immunodeficiency virus (hiv) infection status: Z21

## 2022-06-22 HISTORY — DX: Adverse effect of unspecified anesthetic, initial encounter: T41.45XA

## 2022-06-22 HISTORY — DX: Other complications of anesthesia, initial encounter: T88.59XA

## 2022-06-23 ENCOUNTER — Inpatient Hospital Stay (HOSPITAL_COMMUNITY): Payer: Managed Care, Other (non HMO)

## 2022-06-23 ENCOUNTER — Inpatient Hospital Stay (HOSPITAL_COMMUNITY)
Admission: AD | Admit: 2022-06-23 | Payer: Managed Care, Other (non HMO) | Source: Home / Self Care | Admitting: Obstetrics and Gynecology

## 2022-06-23 ENCOUNTER — Inpatient Hospital Stay (HOSPITAL_COMMUNITY)
Admission: RE | Admit: 2022-06-23 | Payer: Managed Care, Other (non HMO) | Source: Ambulatory Visit | Admitting: Obstetrics and Gynecology

## 2022-06-29 ENCOUNTER — Telehealth (HOSPITAL_COMMUNITY): Payer: Self-pay | Admitting: *Deleted

## 2022-06-29 NOTE — Telephone Encounter (Signed)
Left phone voicemail message.  Duffy Rhody, RN 06-29-2022 at 11:12am

## 2023-11-17 DIAGNOSIS — Z800 Family history of malignant neoplasm of digestive organs: Secondary | ICD-10-CM | POA: Insufficient documentation

## 2023-11-17 DIAGNOSIS — Z1211 Encounter for screening for malignant neoplasm of colon: Secondary | ICD-10-CM | POA: Insufficient documentation

## 2024-01-29 ENCOUNTER — Ambulatory Visit: Admission: EM | Admit: 2024-01-29 | Discharge: 2024-01-29 | Disposition: A | Discharge status: Home / Self Care

## 2024-01-29 ENCOUNTER — Ambulatory Visit (INDEPENDENT_AMBULATORY_CARE_PROVIDER_SITE_OTHER)

## 2024-01-29 DIAGNOSIS — O321XX0 Maternal care for breech presentation, not applicable or unspecified: Secondary | ICD-10-CM | POA: Insufficient documentation

## 2024-01-29 DIAGNOSIS — O4440 Low lying placenta NOS or without hemorrhage, unspecified trimester: Secondary | ICD-10-CM | POA: Insufficient documentation

## 2024-01-29 DIAGNOSIS — Z8279 Family history of other congenital malformations, deformations and chromosomal abnormalities: Secondary | ICD-10-CM | POA: Insufficient documentation

## 2024-01-29 DIAGNOSIS — S63642A Sprain of metacarpophalangeal joint of left thumb, initial encounter: Secondary | ICD-10-CM | POA: Diagnosis not present

## 2024-01-29 DIAGNOSIS — Z148 Genetic carrier of other disease: Secondary | ICD-10-CM | POA: Insufficient documentation

## 2024-01-29 DIAGNOSIS — O09529 Supervision of elderly multigravida, unspecified trimester: Secondary | ICD-10-CM | POA: Insufficient documentation

## 2024-01-29 NOTE — ED Provider Notes (Signed)
 [facility]    CSN: [id number] Arrival date & time: 01/29/24  1028      History   Chief Complaint Chief Complaint  Patient presents with   Finger Injury    HPI Megan Mcclain is a 38 y.o. female.   38 year old female who presents urgent care with complaints of left thumb pain.  This started at about 10:00 this morning when she was getting up from a crosslegged position off the floor and felt her thumb pushed back in the wrong direction.  She has a history of dislocating her thumb in the past.  It has become swollen and bruised.  She has limited motion with it.  She has been icing the area.     Past Medical History:  Diagnosis Date   Adverse effect of anesthesia    numbing medications do not last as long as they should   Allergy    Complication of anesthesia    allergic to novacaine   HIV (human immunodeficiency virus infection) [facility])     Patient Active Problem List   Diagnosis Date Noted   Family history of cleft palate with cleft lip 01/29/2024   Breech presentation 01/29/2024   Low-lying placenta 01/29/2024   Carrier of genetic disorder 01/29/2024   Advanced maternal age in multigravida 01/29/2024   Encounter for screening colonoscopy 11/17/2023   Family history of colon cancer 11/17/2023   Pregnancy 06/18/2022   Pregnancy and infectious disease, second trimester 01/16/2022   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   Atopic dermatitis 11/02/2019   Vaginitis 07/03/2016   HIV positive (HCC) 06/30/2016   Asymptomatic human immunodeficiency virus (hiv) infection status (HCC) 06/08/2016   Pharyngitis 12/12/2013   Genital herpes simplex 01/20/2007    Past Surgical History:  Procedure Laterality Date   CESAREAN SECTION N/A 06/19/2022   Procedure: CESAREAN SECTION;  Surgeon: Lyn Henri, MD;  Location: [facility] LD ORS;  Service: Obstetrics;  Laterality: N/A;   WISDOM TOOTH EXTRACTION      OB History     Gravida  1   Para      Term       Preterm      AB      Living         SAB      IAB      Ectopic      Multiple      Live Births               Home Medications    Prior to Admission medications   Medication Sig Start Date End Date Taking? Authorizing Provider  ALPRAZolam Prudy Feeler) 1 MG tablet Take 1 mg by mouth as needed. 06/03/16  Yes [provider]  emtricitabine-rilpivir-tenofovir AF (ODEFSEY) 200-25-25 MG TABS tablet Take 1 tablet by mouth daily with breakfast. 11/16/23  Yes [provider]  Na Sulfate-K Sulfate-Mg Sulfate concentrate (SUPREP) 17.5-3.13-1.6 GM/177ML SOLN Take 1 kit by mouth once. 11/17/23  Yes [provider]  norethindrone (MICRONOR) 0.35 MG tablet Take 1 tablet by mouth daily. 11/25/23  Yes [provider]  valACYclovir (VALTREX) 1000 MG tablet Take 1 tablet by mouth daily. 09/08/21  Yes [provider]  albuterol (VENTOLIN HFA) 108 (90 Base) MCG/ACT inhaler Inhale 2 puffs into the lungs every 4 (four) hours as needed for wheezing or shortness of breath.    [provider]  amoxicillin (AMOXIL) 875 MG tablet Take 875 mg by mouth 2 (two) times daily.    [provider]  Clobetasol Propionate (TEMOVATE) 0.05 % external spray Apply 1 Application topically 2 (two) times daily.    [provider]  DESCOVY 200-25 MG tablet Take 1 tablet by mouth daily. 06/11/22   [provider]  docusate sodium (COLACE) 100 MG capsule Take 1 capsule (100 mg total) by mouth 2 (two) times daily. 06/21/22   Ranae Pila, MD  doxycycline (VIBRA-TABS) 100 MG tablet Take 100 mg by mouth 2 (two) times daily.    [provider]  fluconazole (DIFLUCAN) 150 MG tablet Take 150 mg by mouth once.    [provider]  norethindrone (MICRONOR) 0.35 MG tablet Take 1 tablet by mouth daily.    [provider]  Prenatal MV & Min w/FA-DHA (PRENATAL GUMMIES PO) Take 1 each by mouth daily at 2 PM.    [provider]   Prenatal Vit-Fe Fumarate-FA (PRENATAL VITAMIN PO) Take 1 tablet by mouth daily.    [provider]  TIVICAY 50 MG tablet Take 50 mg by mouth daily. 06/16/22   [provider]  traMADol (ULTRAM) 50 MG tablet Take 1 tablet (50 mg total) by mouth every 6 (six) hours as needed for moderate pain. 06/21/22   Ranae Pila, MD  triamcinolone (NASACORT ALLERGY 24HR) 55 MCG/ACT AERO nasal inhaler Place 1 spray into the nose daily as needed (allergies).    [provider]  triamcinolone cream (KENALOG) 0.1 % Apply 1 Application topically 2 (two) times daily.    [provider]  UNKNOWN TO PATIENT compound drug TK 5 MLS PO QID UTD    [provider]  valACYclovir (VALTREX) 1000 MG tablet Take 1,000 mg by mouth daily. 05/12/22   [provider]    Family History Family History  Problem Relation Age of Onset   Diabetes Mother    Stroke Father    Prostate cancer Father    Allergies Father    Allergies Brother    Heart failure Paternal Grandmother     Social History Social History   Tobacco Use   Smoking status: Never   Smokeless tobacco: Never  Vaping Use   Vaping status: Never Used  Substance Use Topics   Alcohol use: Not Currently   Drug use: No     Allergies   Hydrocodone-acetaminophen, Other, Procaine, Hydrocodone, Hydromorphone, and Vicodin [hydrocodone-acetaminophen]   Review of Systems Review of Systems  Constitutional:  Negative for chills and fever.  HENT:  Negative for ear pain and sore throat.   Eyes:  Negative for pain and visual disturbance.  Respiratory:  Negative for cough and shortness of breath.   Cardiovascular:  Negative for chest pain and palpitations.  Gastrointestinal:  Negative for abdominal pain and vomiting.  Genitourinary:  Negative for dysuria and hematuria.  Musculoskeletal:  Negative for arthralgias and back pain.       Left thumb pain  Skin:  Negative for color change and rash.   Neurological:  Negative for seizures and syncope.  All other systems reviewed and are negative.    Physical Exam Triage Vital Signs ED Triage Vitals  Encounter Vitals Group     BP 01/29/24 1047 120/75     Systolic BP Percentile --      Diastolic BP Percentile --      Pulse Rate 01/29/24 1047 60     Resp 01/29/24 1047 18     Temp 01/29/24 1047 98.3 F (36.8 C)     Temp Source 01/29/24 1047 Oral  SpO2 01/29/24 1047 98 %     Weight 01/29/24 1044 135 lb (61.2 kg)     Height 01/29/24 1044 5\' 3"  (1.6 m)     Head Circumference --      Peak Flow --      Pain Score 01/29/24 1041 10     Pain Loc --      Pain Education --      Exclude from Growth Chart --    No data found.  Updated Vital Signs BP 120/75 (BP Location: Left Arm)   Pulse 60   Temp 98.3 F (36.8 C) (Oral)   Resp 18   Ht 5\' 3"  (1.6 m)   Wt 135 lb (61.2 kg)   LMP 01/24/2024 (Exact Date)   SpO2 98%   BMI 23.91 kg/m   Visual Acuity Right Eye Distance:   Left Eye Distance:   Bilateral Distance:    Right Eye Near:   Left Eye Near:    Bilateral Near:     Physical Exam Vitals and nursing note reviewed.  Constitutional:      General: She is not in acute distress.    Appearance: She is well-developed.  HENT:     Head: Normocephalic and atraumatic.  Eyes:     Conjunctiva/sclera: Conjunctivae normal.  Cardiovascular:     Rate and Rhythm: Normal rate and regular rhythm.     Heart sounds: No murmur heard. Pulmonary:     Effort: Pulmonary effort is normal. No respiratory distress.     Breath sounds: Normal breath sounds.  Abdominal:     Palpations: Abdomen is soft.  Musculoskeletal:        General: No swelling.     Left hand: Swelling and tenderness present. Decreased range of motion. Normal sensation. Normal capillary refill. Normal pulse.     Cervical back: Neck supple.     Comments: Bruising and swelling present along the thenar aspect of the thumb, limited range of motion but range of motion is  present  Skin:    General: Skin is warm and dry.     Capillary Refill: Capillary refill takes less than 2 seconds.  Neurological:     Mental Status: She is alert.  Psychiatric:        Mood and Affect: Mood normal.      UC Treatments / Results  Labs (all labs ordered are listed, but only abnormal results are displayed) Labs Reviewed - No data to display  EKG   Radiology No results found.  Procedures Procedures (including critical care time)  Medications Ordered in UC Medications - No data to display  Initial Impression / Assessment and Plan / UC Course  I have reviewed the triage vital signs and the nursing notes.  Pertinent labs & imaging results that were available during my care of the patient were reviewed by me and considered in my medical decision making (see chart for details).     Sprain of metacarpophalangeal (MCP) joint of left thumb, initial encounter          X-ray of the left thumb done today.  Final evaluation by the radiologist still pending but on brief evaluation there does not appear to be any gross fracture however cannot rule out a tendon or ligament injury.  Reassuringly there is movement of the thumb although somewhat limited by swelling.  We will treat with the following: Left thumb spica placed today.  May remove this when showering or for hand hygiene.  Wear this for the  next 2 to 3 days.  After this may remove and try activity without the brace.  If no pain then may discontinue the brace.  If pain develops then recommend replacing the brace and contacting orthopedic's for further evaluation Tylenol every 4-6 hours as needed for pain Remove the brace to ice the area for 10 to 15 minutes 2-3 times daily to help with swelling May return to urgent care as needed.     Final Clinical Impressions(s) / UC Diagnoses   Final diagnoses:  Sprain of metacarpophalangeal (MCP) joint of left thumb, initial encounter     Discharge Instructions       X-ray of the left thumb done today.  Final evaluation by the radiologist still pending but on brief evaluation there does not appear to be any gross fracture however cannot rule out a tendon or ligament injury.  Reassuringly there is movement of the thumb although somewhat limited by swelling.  We will treat with the following: Left thumb spica placed today.  May remove this when showering or for hand hygiene.  Wear this for the next 2 to 3 days.  After this may remove and try activity without the brace.  If no pain then may discontinue the brace.  If pain develops then recommend replacing the brace and contacting orthopedic's for further evaluation Tylenol every 4-6 hours as needed for pain Remove the brace to ice the area for 10 to 15 minutes 2-3 times daily to help with swelling May return to urgent care as needed.   ED Prescriptions   None    PDMP not reviewed this encounter.   Landis Martins, [place] 01/29/24 1145

## 2024-01-29 NOTE — ED Triage Notes (Signed)
"  I think I might have broke my left thumb". "Sitting on the floor with my two little girls and when raising up lifting by hands bent my left thumb back". No pain. Swelling. Decreased ROM. DOI: Today @ 1000am.

## 2024-01-29 NOTE — Discharge Instructions (Addendum)
 X-ray of the left thumb done today.  Final evaluation by the radiologist still pending but on brief evaluation there does not appear to be any gross fracture however cannot rule out a tendon or ligament injury.  If the radiologist sees something different then we will contact you and make recommendations for orthopedics at that time.  Reassuringly there is movement of the thumb although somewhat limited by swelling.  We will treat with the following: Left thumb spica placed today.  May remove this when showering or for hand hygiene.  Wear this for the next 2 to 3 days.  After this may remove and try activity without the brace.  If no pain then may discontinue the brace.  If pain develops then recommend replacing the brace and contacting orthopedic's for further evaluation Tylenol every 4-6 hours as needed for pain Remove the brace to ice the area for 10 to 15 minutes 2-3 times daily to help with swelling May return to urgent care as needed.

## 2024-06-20 ENCOUNTER — Ambulatory Visit
Admission: EM | Admit: 2024-06-20 | Discharge: 2024-06-20 | Disposition: A | Discharge status: Home / Self Care | Payer: Self-pay | Attending: Physician Assistant | Admitting: Physician Assistant

## 2024-06-20 ENCOUNTER — Other Ambulatory Visit: Payer: Self-pay

## 2024-06-20 ENCOUNTER — Encounter: Payer: Self-pay | Admitting: Emergency Medicine

## 2024-06-20 DIAGNOSIS — J029 Acute pharyngitis, unspecified: Secondary | ICD-10-CM | POA: Diagnosis present

## 2024-06-20 LAB — POCT RAPID STREP A (OFFICE): Rapid Strep A Screen: NEGATIVE

## 2024-06-20 LAB — POC SOFIA SARS ANTIGEN FIA: SARS Coronavirus 2 Ag: NEGATIVE

## 2024-06-20 MED ORDER — FLUCONAZOLE 150 MG PO TABS
ORAL_TABLET | ORAL | 0 refills | Status: AC
Start: 2024-06-20 — End: ?

## 2024-06-20 MED ORDER — AMOXICILLIN 500 MG PO CAPS: 500.0000 mg | ORAL_CAPSULE | Freq: Two times a day (BID) | ORAL | 0 refills | Status: AC

## 2024-06-20 NOTE — ED Triage Notes (Signed)
 Pt here for sore throat and N/V/D x 2 days with some fever

## 2024-06-21 ENCOUNTER — Ambulatory Visit: Payer: Self-pay

## 2024-06-22 NOTE — ED Provider Notes (Signed)
 [facility]    CSN: [id number] Arrival date & time: 06/20/24  0856      History   Chief Complaint Chief Complaint  Patient presents with   Sore Throat   Emesis    HPI Megan Mcclain is a 38 y.o. female.   Patient here today for evaluation of sore throat, nausea, vomiting and diarrhea that started 2 days ago.  She reports she has had some fever.  The history is provided by the patient.  Sore Throat Pertinent negatives include no abdominal pain and no shortness of breath.  Emesis Associated symptoms: diarrhea, fever and sore throat   Associated symptoms: no abdominal pain and no cough     Past Medical History:  Diagnosis Date   Adverse effect of anesthesia    numbing medications do not last as long as they should   Allergy    Complication of anesthesia    allergic to novacaine   HIV (human immunodeficiency virus infection) [facility])     Patient Active Problem List   Diagnosis Date Noted   Family history of cleft palate with cleft lip 01/29/2024   Breech presentation 01/29/2024   Low-lying placenta 01/29/2024   Carrier of genetic disorder 01/29/2024   Advanced maternal age in multigravida 01/29/2024   Encounter for screening colonoscopy 11/17/2023   Family history of colon cancer 11/17/2023   Pregnancy 06/18/2022   Pregnancy and infectious disease, second trimester 01/16/2022   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   Atopic dermatitis 11/02/2019   Vaginitis 07/03/2016   HIV positive (HCC) 06/30/2016   Asymptomatic human immunodeficiency virus (hiv) infection status (HCC) 06/08/2016   Pharyngitis 12/12/2013   Genital herpes simplex 01/20/2007    Past Surgical History:  Procedure Laterality Date   CESAREAN SECTION N/A 06/19/2022   Procedure: CESAREAN SECTION;  Surgeon: Lequita Evalene LABOR, MD;  Location: [facility] LD ORS;  Service: Obstetrics;  Laterality: N/A;   WISDOM TOOTH EXTRACTION      OB History     Gravida  1   Para      Term      Preterm       AB      Living         SAB      IAB      Ectopic      Multiple      Live Births               Home Medications    Prior to Admission medications   Medication Sig Start Date End Date Taking? Authorizing Provider  amoxicillin  (AMOXIL ) 500 MG capsule Take 1 capsule (500 mg total) by mouth 2 (two) times daily for 10 days. 06/20/24 06/30/24 Yes Billy Asberry FALCON, PA-C  fluconazole  (DIFLUCAN ) 150 MG tablet Take one tab PO today and repeat dose in 3 days if symptoms persist 06/20/24  Yes Billy Asberry FALCON, PA-C  albuterol (VENTOLIN HFA) 108 (90 Base) MCG/ACT inhaler Inhale 2 puffs into the lungs every 4 (four) hours as needed for wheezing or shortness of breath.    [provider]  ALPRAZolam SHEFFIELD) 1 MG tablet Take 1 mg by mouth as needed. 06/03/16   [provider]  Clobetasol Propionate (TEMOVATE) 0.05 % external spray Apply 1 Application topically 2 (two) times daily.    [provider]  DESCOVY  200-25 MG tablet Take 1 tablet by mouth daily. 06/11/22   [provider]  docusate sodium  (COLACE) 100 MG capsule Take 1 capsule (100 mg  total) by mouth 2 (two) times daily. 06/21/22   Marne Kelly Nest, MD  doxycycline  (VIBRA -TABS) 100 MG tablet Take 100 mg by mouth 2 (two) times daily.    [provider]  emtricitabine -rilpivir-tenofovir  AF (ODEFSEY) 200-25-25 MG TABS tablet Take 1 tablet by mouth daily with breakfast. 11/16/23   [provider]  Na Sulfate-K Sulfate-Mg Sulfate concentrate (SUPREP) 17.5-3.13-1.6 GM/177ML SOLN Take 1 kit by mouth once. 11/17/23   [provider]  norethindrone (MICRONOR) 0.35 MG tablet Take 1 tablet by mouth daily. 11/25/23   [provider]  norethindrone (MICRONOR) 0.35 MG tablet Take 1 tablet by mouth daily.    [provider]  Prenatal MV & Min w/FA-DHA (PRENATAL GUMMIES PO) Take 1 each by mouth daily at 2 PM.    [provider]  Prenatal Vit-Fe Fumarate-FA  (PRENATAL VITAMIN PO) Take 1 tablet by mouth daily.    [provider]  TIVICAY  50 MG tablet Take 50 mg by mouth daily. 06/16/22   [provider]  traMADol  (ULTRAM ) 50 MG tablet Take 1 tablet (50 mg total) by mouth every 6 (six) hours as needed for moderate pain. 06/21/22   Marne Kelly Nest, MD  triamcinolone (NASACORT ALLERGY 24HR) 55 MCG/ACT AERO nasal inhaler Place 1 spray into the nose daily as needed (allergies).    [provider]  triamcinolone cream (KENALOG) 0.1 % Apply 1 Application topically 2 (two) times daily.    [provider]  UNKNOWN TO PATIENT compound drug TK 5 MLS PO QID UTD    [provider]  valACYclovir (VALTREX) 1000 MG tablet Take 1,000 mg by mouth daily. 05/12/22   [provider]  valACYclovir (VALTREX) 1000 MG tablet Take 1 tablet by mouth daily. 09/08/21   [provider]    Family History Family History  Problem Relation Age of Onset   Diabetes Mother    Stroke Father    Prostate cancer Father    Allergies Father    Allergies Brother    Heart failure Paternal Grandmother     Social History Social History   Tobacco Use   Smoking status: Never   Smokeless tobacco: Never  Vaping Use   Vaping status: Never Used  Substance Use Topics   Alcohol use: Not Currently   Drug use: No     Allergies   Hydrocodone -acetaminophen , Other, Procaine, Hydrocodone , Hydromorphone , and Vicodin [hydrocodone -acetaminophen ]   Review of Systems Review of Systems  Constitutional:  Positive for fever.  HENT:  Positive for congestion and sore throat. Negative for ear pain.   Eyes:  Negative for discharge and redness.  Respiratory:  Negative for cough, shortness of breath and wheezing.   Gastrointestinal:  Positive for diarrhea, nausea and vomiting. Negative for abdominal pain.     Physical Exam Triage Vital Signs ED Triage Vitals  Encounter Vitals Group     BP 06/20/24 1050 123/83     Girls  Systolic BP Percentile --      Girls Diastolic BP Percentile --      Boys Systolic BP Percentile --      Boys Diastolic BP Percentile --      Pulse Rate 06/20/24 1050 80     Resp 06/20/24 1050 18     Temp 06/20/24 1050 98.3 F (36.8 C)     Temp Source 06/20/24 1050 Oral     SpO2 06/20/24 1050 98 %     Weight --      Height --  Head Circumference --      Peak Flow --      Pain Score 06/20/24 1051 5     Pain Loc --      Pain Education --      Exclude from Growth Chart --    No data found.  Updated Vital Signs BP 123/83 (BP Location: Left Arm)   Pulse 80   Temp 98.3 F (36.8 C) (Oral)   Resp 18   SpO2 98%   Visual Acuity Right Eye Distance:   Left Eye Distance:   Bilateral Distance:    Right Eye Near:   Left Eye Near:    Bilateral Near:     Physical Exam Vitals and nursing note reviewed.  Constitutional:      General: She is not in acute distress.    Appearance: Normal appearance. She is not ill-appearing.  HENT:     Head: Normocephalic and atraumatic.     Nose: Congestion present.     Mouth/Throat:     Mouth: Mucous membranes are moist.     Pharynx: No oropharyngeal exudate or posterior oropharyngeal erythema.  Eyes:     Conjunctiva/sclera: Conjunctivae normal.  Cardiovascular:     Rate and Rhythm: Normal rate and regular rhythm.     Heart sounds: Normal heart sounds. No murmur heard. Pulmonary:     Effort: Pulmonary effort is normal. No respiratory distress.     Breath sounds: Normal breath sounds. No wheezing, rhonchi or rales.  Skin:    General: Skin is warm and dry.  Neurological:     Mental Status: She is alert.  Psychiatric:        Mood and Affect: Mood normal.        Thought Content: Thought content normal.      UC Treatments / Results  Labs (all labs ordered are listed, but only abnormal results are displayed) Labs Reviewed  POCT RAPID STREP A (OFFICE) - Normal  CULTURE, GROUP A STREP ([facility])  POC SOFIA SARS ANTIGEN FIA     EKG   Radiology No results found.  Procedures Procedures (including critical care time)  Medications Ordered in UC Medications - No data to display  Initial Impression / Assessment and Plan / UC Course  I have reviewed the triage vital signs and the nursing notes.  Pertinent labs & imaging results that were available during my care of the patient were reviewed by me and considered in my medical decision making (see chart for details).    Rapid strep and COVID screening negative.  Will treat to cover possible bacterial pharyngitis given fever.  Throat culture ordered.  Encouraged follow-up if no gradual improvement with any further concerns.  Patient requested Diflucan  to cover possible impending yeast infection with antibiotic therapy.  Diflucan  prescribed.  Final Clinical Impressions(s) / UC Diagnoses   Final diagnoses:  Acute pharyngitis, unspecified etiology   Discharge Instructions   None    ED Prescriptions     Medication Sig Dispense Auth. Provider   amoxicillin  (AMOXIL ) 500 MG capsule Take 1 capsule (500 mg total) by mouth 2 (two) times daily for 10 days. 20 capsule Billy Stabs F, PA-C   fluconazole  (DIFLUCAN ) 150 MG tablet Take one tab PO today and repeat dose in 3 days if symptoms persist 2 tablet Billy Stabs FALCON, PA-C      PDMP not reviewed this encounter.   Billy Stabs FALCON, PA-C 06/22/24 [phone]

## 2024-06-23 LAB — CULTURE, GROUP A STREP (THRC)
